# Patient Record
Sex: Male | Born: 1985
Health system: Southern US, Community
[De-identification: ages and names within clinical notes are randomized; demographics above are authoritative.]

## PROBLEM LIST (undated history)

## (undated) DIAGNOSIS — A630 Anogenital (venereal) warts: Secondary | ICD-10-CM

## (undated) DIAGNOSIS — S42302A Unspecified fracture of shaft of humerus, left arm, initial encounter for closed fracture: Secondary | ICD-10-CM

## (undated) DIAGNOSIS — F419 Anxiety disorder, unspecified: Secondary | ICD-10-CM

## (undated) DIAGNOSIS — R1013 Epigastric pain: Secondary | ICD-10-CM

## (undated) DIAGNOSIS — E663 Overweight: Secondary | ICD-10-CM

## (undated) DIAGNOSIS — F988 Other specified behavioral and emotional disorders with onset usually occurring in childhood and adolescence: Secondary | ICD-10-CM

## (undated) HISTORY — DX: Other specified behavioral and emotional disorders with onset usually occurring in childhood and adolescence: F98.8

## (undated) HISTORY — DX: Overweight: E66.3

## (undated) HISTORY — DX: Anogenital (venereal) warts: A63.0

## (undated) HISTORY — DX: Anxiety disorder, unspecified: F41.9

## (undated) HISTORY — DX: Epigastric pain: R10.13

## (undated) HISTORY — DX: Unspecified fracture of shaft of humerus, left arm, initial encounter for closed fracture: S42.302A

---
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
## 2018-10-25 ENCOUNTER — Other Ambulatory Visit: Payer: Self-pay

## 2018-10-25 ENCOUNTER — Encounter: Payer: Self-pay | Admitting: Internal Medicine

## 2018-10-25 ENCOUNTER — Ambulatory Visit: Payer: 59 | Admitting: Internal Medicine

## 2018-10-25 VITALS — BP 131/83 | HR 88 | Temp 97.9°F | Resp 12 | Ht 72.0 in | Wt 237.0 lb

## 2018-10-25 DIAGNOSIS — F900 Attention-deficit hyperactivity disorder, predominantly inattentive type: Secondary | ICD-10-CM

## 2018-10-25 MED ORDER — AMPHETAMINE-DEXTROAMPHETAMINE 15 MG PO TABS: ORAL_TABLET | ORAL | 0 refills | Status: DC

## 2018-10-25 NOTE — Progress Notes (Signed)
S - Presents for renewal of ADHD medicine. Last visit was late April with BP check then 120/78. Last fill date on PMP was 07/06/2018. He noted him and his wife had their baby, all healthy and just has not been able to get in to be seen. He is back at work and notes struggling with power points, reading same lines over 5-6 times and notices not being on the medicine in the recent past. When using prior, denied any SE concerns with no CP/palpitations/heart racing, no insomnia,    Current Outpatient Medications on File Prior to Visit  Medication Sig Dispense Refill  . amphetamine-dextroamphetamine (ADDERALL) 15 MG tablet Take 15 mg by mouth 2 (two) times daily.     No current facility-administered medications on file prior to visit.      Allergies  Allergen Reactions  . Sulfa Antibiotics     Unsure of reaction.  Said it occurred during childhood & it's what his mother told him    No tob hx  Records from his paper chart reviewed   O - NAD, masked  BP 131/83 (BP Location: Right Arm, Patient Position: Sitting, Cuff Size: Large)   Pulse 88   Temp 97.9 F (36.6 C) (Oral)   Resp 12   Ht 6' (1.829 m)   Wt 237 lb (107.5 kg)   SpO2 100%   BMI 32.14 kg/m    Heent - sclera anicteric Car - RRR without m/g/r Ext - no LE edema Neuro - Affect not flat, approp with conversation, speech is not rapid  Ass - ADHD - tolerating stimulant medicine to help in the past, brief time away recently and notices without   Plan - Has signed [place] P[place] reviewed Renewed medicine and aware cannot put refills on prescription Days away still encouraged and periodic days without medicine can be beneficial and also rec'ed using once to twice daily and if does not need that second dose, all the better with the goal being the least amount of the medicine needed at present Follow-up in person visits needed every three months, with refills possible through EHR between, should contact the office when running low on the  medicine to help generate this refill, and prn otherwise

## 2018-11-22 ENCOUNTER — Telehealth: Payer: Self-pay

## 2018-11-22 NOTE — Telephone Encounter (Signed)
Keith Wiggins questioning if he needs to come in for Rx refill for Adderall since Dr. Roxan Hockey no longer at our office.  Last office visit with Dr. Roxan Hockey was 10/25/2018.  I don't think he'll have to come in.  I will speak with the provider.  If they want to see him, I will let him know so he can schedule an appt.  AMD

## 2018-11-23 ENCOUNTER — Other Ambulatory Visit: Payer: Self-pay

## 2018-11-23 DIAGNOSIS — Z0289 Encounter for other administrative examinations: Secondary | ICD-10-CM

## 2018-11-23 DIAGNOSIS — F900 Attention-deficit hyperactivity disorder, predominantly inattentive type: Secondary | ICD-10-CM

## 2018-11-23 MED ORDER — AMPHETAMINE-DEXTROAMPHETAMINE 15 MG PO TABS: ORAL_TABLET | ORAL | 0 refills | Status: DC

## 2018-11-23 NOTE — Telephone Encounter (Signed)
Last office visit with Dr Roxan Hockey 10/25/2018 next due Dec 2020 as face to face required every 3 months for refills per clinic policy.  Electronic Rx sent to his pharmacy of choice today.  [place] PMP website reviewed; 21 Rx all from [facility] providers in the previous 2 years.  Last fill 10/25/2018.

## 2018-12-02 LAB — POCT URINALYSIS DIPSTICK
Bilirubin, UA: NEGATIVE
Blood, UA: NEGATIVE
Glucose, UA: NEGATIVE
Ketones, UA: NEGATIVE
Leukocytes, UA: NEGATIVE
Nitrite, UA: NEGATIVE
Protein, UA: NEGATIVE
Spec Grav, UA: 1.015 (ref 1.010–1.025)
Urobilinogen, UA: 0.2 E.U./dL
pH, UA: 6 (ref 5.0–8.0)

## 2018-12-20 ENCOUNTER — Other Ambulatory Visit: Payer: Self-pay | Admitting: Registered Nurse

## 2018-12-20 ENCOUNTER — Encounter: Payer: Self-pay | Admitting: Registered Nurse

## 2018-12-20 NOTE — Telephone Encounter (Signed)
Last appt with dr Roxan Hockey 10/2018.  Reviewed [place] PMP website.  Last fill 11/23/2018 by me. All other Rx 21 in past 2 years with [facility] [facility] provider. Electronic Rx sent to his pharmacy of choice for refill adderall 15mg  1-2 po daily #60 RF0.  Patient will need face to face visit for next fill Dec 2020.

## 2019-01-04 ENCOUNTER — Telehealth: Payer: Self-pay

## 2019-01-04 NOTE — Telephone Encounter (Signed)
Wellons, Marinell Blight, RN        I have emailed and called patient still no response.   Previous Messages  ----- Message -----  From: Aliene Altes, RN  Sent: 12/20/2018  3:03 PM EST  To: Madelin Headings  Subject: Appointment                    Tina refilled Adderall for November 2020   Needs to schedule an appointment for December 2020 for next refill of medication.   AMD

## 2019-01-10 ENCOUNTER — Ambulatory Visit: Payer: 59 | Admitting: Occupational Medicine

## 2019-01-10 ENCOUNTER — Other Ambulatory Visit: Payer: Self-pay

## 2019-01-10 ENCOUNTER — Encounter: Payer: Self-pay | Admitting: Occupational Medicine

## 2019-01-10 VITALS — BP 120/70 | HR 91 | Temp 98.6°F | Resp 12 | Ht 72.0 in | Wt 234.0 lb

## 2019-01-10 DIAGNOSIS — F988 Other specified behavioral and emotional disorders with onset usually occurring in childhood and adolescence: Secondary | ICD-10-CM

## 2019-01-10 DIAGNOSIS — Z0000 Encounter for general adult medical examination without abnormal findings: Secondary | ICD-10-CM

## 2019-01-11 MED ORDER — AMPHETAMINE-DEXTROAMPHETAMINE 15 MG PO TABS: ORAL_TABLET | ORAL | 0 refills | Status: DC

## 2019-01-11 NOTE — Telephone Encounter (Signed)
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  Patient stable on therapy reviewed PMP website printout see paper chart at [facility] last fill 12/20/2018 all Rx from [facility] providers in previous 2 years.  Next face to face required 90 days Mar 2021.

## 2019-01-14 ENCOUNTER — Emergency Department: Payer: 59

## 2019-01-14 ENCOUNTER — Emergency Department
Admission: EM | Admit: 2019-01-14 | Discharge: 2019-01-14 | Disposition: A | Discharge status: Home / Self Care | Payer: 59 | Attending: Emergency Medicine | Admitting: Emergency Medicine

## 2019-01-14 ENCOUNTER — Encounter: Payer: Self-pay | Admitting: Emergency Medicine

## 2019-01-14 ENCOUNTER — Other Ambulatory Visit: Payer: Self-pay

## 2019-01-14 DIAGNOSIS — M79604 Pain in right leg: Secondary | ICD-10-CM | POA: Insufficient documentation

## 2019-01-14 DIAGNOSIS — R079 Chest pain, unspecified: Secondary | ICD-10-CM | POA: Insufficient documentation

## 2019-01-14 DIAGNOSIS — M7989 Other specified soft tissue disorders: Secondary | ICD-10-CM | POA: Diagnosis not present

## 2019-01-14 DIAGNOSIS — R0781 Pleurodynia: Secondary | ICD-10-CM

## 2019-01-14 DIAGNOSIS — M79661 Pain in right lower leg: Secondary | ICD-10-CM | POA: Diagnosis not present

## 2019-01-14 DIAGNOSIS — R0602 Shortness of breath: Secondary | ICD-10-CM | POA: Diagnosis present

## 2019-01-14 LAB — CBC WITH DIFFERENTIAL/PLATELET
Abs Immature Granulocytes: 0.02 10*3/uL (ref 0.00–0.07)
Basophils Absolute: 0.1 10*3/uL (ref 0.0–0.1)
Basophils Relative: 1 %
Eosinophils Absolute: 0.2 10*3/uL (ref 0.0–0.5)
Eosinophils Relative: 2 %
HCT: 42.2 % (ref 39.0–52.0)
Hemoglobin: 14.8 g/dL (ref 13.0–17.0)
Immature Granulocytes: 0 %
Lymphocytes Relative: 30 %
Lymphs Abs: 2.4 10*3/uL (ref 0.7–4.0)
MCH: 30.6 pg (ref 26.0–34.0)
MCHC: 35.1 g/dL (ref 30.0–36.0)
MCV: 87.2 fL (ref 80.0–100.0)
Monocytes Absolute: 0.8 10*3/uL (ref 0.1–1.0)
Monocytes Relative: 10 %
Neutro Abs: 4.7 10*3/uL (ref 1.7–7.7)
Neutrophils Relative %: 57 %
Platelets: 322 10*3/uL (ref 150–400)
RBC: 4.84 MIL/uL (ref 4.22–5.81)
RDW: 12.7 % (ref 11.5–15.5)
WBC: 8.2 10*3/uL (ref 4.0–10.5)
nRBC: 0 % (ref 0.0–0.2)

## 2019-01-14 LAB — COMPREHENSIVE METABOLIC PANEL
ALT: 25 U/L (ref 0–44)
AST: 17 U/L (ref 15–41)
Albumin: 4.7 g/dL (ref 3.5–5.0)
Alkaline Phosphatase: 58 U/L (ref 38–126)
Anion gap: 6 (ref 5–15)
BUN: 14 mg/dL (ref 6–20)
CO2: 27 mmol/L (ref 22–32)
Calcium: 9.4 mg/dL (ref 8.9–10.3)
Chloride: 104 mmol/L (ref 98–111)
Creatinine, Ser: 0.96 mg/dL (ref 0.61–1.24)
GFR calc Af Amer: >60 mL/min (ref >60)
GFR calc non Af Amer: >60 mL/min (ref >60)
Glucose, Bld: 84 mg/dL (ref 70–99)
Potassium: 3.9 mmol/L (ref 3.5–5.1)
Sodium: 137 mmol/L (ref 135–145)
Total Bilirubin: 0.9 mg/dL (ref 0.3–1.2)
Total Protein: 7.9 g/dL (ref 6.5–8.1)

## 2019-01-14 LAB — TROPONIN I (HIGH SENSITIVITY): Troponin I (High Sensitivity): <2 ng/L (ref <18)

## 2019-01-14 LAB — FIBRIN DERIVATIVES D-DIMER (ARMC ONLY): Fibrin derivatives D-dimer (ARMC): 151.41 ng/mL (FEU) (ref 0.00–499.00)

## 2019-01-14 NOTE — ED Provider Notes (Addendum)
[facility] Emergency Department Provider Note       Time seen: ----------------------------------------- 12:41 PM on 01/14/2019 -----------------------------------------   I have reviewed the triage vital signs and the nursing notes.  HISTORY   Chief Complaint states feels like he cannot get a deep breath    HPI Keith Wiggins is a 33 y.o. male with a history of dyspepsia, ADD who presents to the ED for sensation of trouble getting a deep breath since yesterday.  He also has had some firmness in his right calf for the past 3 months and is concerned he has a DVT.  Pain is 4 out of 10 in the right leg.  Past Medical History:  Diagnosis Date  . ADD (attention deficit disorder)   . Arm fracture, left   . Dyspepsia   . Genital warts   . Overweight     Patient Active Problem List   Diagnosis Date Noted  . Attention deficit hyperactivity disorder (ADHD), predominantly inattentive type 10/25/2018    History reviewed. No pertinent surgical history.  Allergies Sulfa antibiotics  Social History Social History   Tobacco Use  . Smoking status: Never Smoker  . Smokeless tobacco: Never Used  Substance Use Topics  . Alcohol use: Yes  . Drug use: Not on file   Review of Systems Constitutional: Negative for fever. Cardiovascular: Negative for chest pain. Respiratory: Positive for shortness of breath Gastrointestinal: Negative for abdominal pain, vomiting and diarrhea. Musculoskeletal: Positive for right leg pain Skin: Negative for rash. Neurological: Negative for headaches, focal weakness or numbness.  All systems negative/normal/unremarkable except as stated in the HPI  ____________________________________________   PHYSICAL EXAM:  VITAL SIGNS: ED Triage Vitals  Enc Vitals Group     BP 01/14/19 1146 (!) 138/99     Pulse Rate 01/14/19 1146 82     Resp 01/14/19 1146 16     Temp 01/14/19 1146 98 F (36.7 C)     Temp Source 01/14/19 1146  Oral     SpO2 01/14/19 1146 100 %     Weight 01/14/19 1156 230 lb (104.3 kg)     Height 01/14/19 1156 6' (1.829 m)     Head Circumference --      Peak Flow --      Pain Score 01/14/19 1156 4     Pain Loc --      Pain Edu? --      Excl. in GC? --     Constitutional: Alert and oriented. Well appearing and in no distress. Eyes: Conjunctivae are normal. Normal extraocular movements. ENT      Head: Normocephalic and atraumatic.      Nose: No congestion/rhinnorhea.      Mouth/Throat: Mucous membranes are moist.      Neck: No stridor. Cardiovascular: Normal rate, regular rhythm. No murmurs, rubs, or gallops. Respiratory: Normal respiratory effort without tachypnea nor retractions. Breath sounds are clear and equal bilaterally. No wheezes/rales/rhonchi. Gastrointestinal: Soft and nontender. Normal bowel sounds Musculoskeletal: Nontender with normal range of motion in extremities. No lower extremity tenderness nor edema. Neurologic:  Normal speech and language. No gross focal neurologic deficits are appreciated.  Skin:  Skin is warm, dry and intact. No rash noted. Psychiatric: Mood and affect are normal. Speech and behavior are normal.  ____________________________________________  EKG: Interpreted by me.  Sinus rhythm with a rate of 78 bpm, normal PR interval, normal QRS, normal QT  ____________________________________________  ED COURSE:  As part of my medical decision making, I  reviewed the following data within the [place] History obtained from family if available, nursing notes, old chart and ekg, as well as notes from prior ED visits. Patient presented for pleuritic pain and right calf pain, we will assess with labs and imaging as indicated at this time.   Procedures  Keith Wiggins was evaluated in Emergency Department on 01/14/2019 for the symptoms described in the history of present illness. He was evaluated in the context of the global COVID-19 pandemic,  which necessitated consideration that the patient might be at risk for infection with the SARS-CoV-2 virus that causes COVID-19. Institutional protocols and algorithms that pertain to the evaluation of patients at risk for COVID-19 are in a state of rapid change based on information released by regulatory bodies including the CDC and federal and state organizations. These policies and algorithms were followed during the patient's care in the ED.  ____________________________________________   LABS (pertinent positives/negatives)  Labs Reviewed  COMPREHENSIVE METABOLIC PANEL  CBC WITH DIFFERENTIAL/PLATELET  FIBRIN DERIVATIVES D-DIMER ([facility] ONLY)  TROPONIN I (HIGH SENSITIVITY)    RADIOLOGY  Chest x-ray, right lower extremity ultrasound Were unremarkable ____________________________________________   DIFFERENTIAL DIAGNOSIS   Musculoskeletal pain, anxiety, MI, PE, pneumothorax, DVT  FINAL ASSESSMENT AND PLAN  Chest pain, right leg pain   Plan: The patient had presented for right leg pain and trouble breathing. Patient's labs are reassuring. Patient's imaging did not reveal any acute process.  His symptoms seem to be noncardiac and he is otherwise cleared for outpatient follow-up.   Laurence Aly, MD    Note: This note was generated in part or whole with voice recognition software. Voice recognition is usually quite accurate but there are transcription errors that can and very often do occur. I apologize for any typographical errors that were not detected and corrected.     Earleen Newport, MD 01/14/19 1404    Earleen Newport, MD 01/14/19 [phone]

## 2019-01-14 NOTE — ED Triage Notes (Signed)
States has sensation of trouble getting deep breath since yesterday. Has had firmness R calf x 3 months. Concerned he has DVT R leg.

## 2019-02-19 ENCOUNTER — Encounter: Payer: Self-pay | Admitting: Registered Nurse

## 2019-02-19 ENCOUNTER — Other Ambulatory Visit: Payer: Self-pay | Admitting: Registered Nurse

## 2019-02-19 DIAGNOSIS — F988 Other specified behavioral and emotional disorders with onset usually occurring in childhood and adolescence: Secondary | ICD-10-CM

## 2019-02-19 NOTE — Telephone Encounter (Signed)
[facility] patient.  Reviewed [place] PMP website last fill 01/16/2019  21 Rx from 6 [facility] providers in previous 2 years.  Last face to face visit 01/10/2019 with Dr Alto Denver.  BP 120/70 HR 91  Next face to face due 3 months 04/10/2019 with [facility] provider.  Electronic Rx sent with refill to his pharmacy of choice adderall 15mg  sig t1-2 po daily #60 RF0

## 2019-02-25 ENCOUNTER — Other Ambulatory Visit: Payer: Self-pay

## 2019-02-25 ENCOUNTER — Encounter: Payer: Self-pay | Admitting: Gynecology

## 2019-02-25 ENCOUNTER — Ambulatory Visit
Admission: EM | Admit: 2019-02-25 | Discharge: 2019-02-25 | Disposition: A | Discharge status: Home / Self Care | Payer: Managed Care, Other (non HMO) | Attending: Emergency Medicine | Admitting: Emergency Medicine

## 2019-02-25 DIAGNOSIS — R42 Dizziness and giddiness: Secondary | ICD-10-CM | POA: Diagnosis not present

## 2019-02-25 DIAGNOSIS — Z20822 Contact with and (suspected) exposure to covid-19: Secondary | ICD-10-CM | POA: Insufficient documentation

## 2019-02-25 DIAGNOSIS — Z8249 Family history of ischemic heart disease and other diseases of the circulatory system: Secondary | ICD-10-CM | POA: Diagnosis not present

## 2019-02-25 DIAGNOSIS — Z882 Allergy status to sulfonamides status: Secondary | ICD-10-CM | POA: Insufficient documentation

## 2019-02-25 DIAGNOSIS — J069 Acute upper respiratory infection, unspecified: Secondary | ICD-10-CM | POA: Diagnosis not present

## 2019-02-25 MED ORDER — FLUTICASONE PROPIONATE 50 MCG/ACT NA SUSP: 2.0000 | Freq: Every day | NASAL | 0 refills | Status: DC

## 2019-02-25 NOTE — Discharge Instructions (Addendum)
Drink plenty of fluids.  Get adequate rest.  Covid results should be available in 24 to 48 hours.  You should remain out of work until the results have been reported.  If you run high fevers have coughing or feeling worse or noticed any increase in your symptoms or any changes return to our clinic or go to the emergency room.

## 2019-02-25 NOTE — ED Provider Notes (Signed)
[facility]    CSN: [id number] Arrival date & time: 02/25/19  0817      History   Chief Complaint Chief Complaint  Patient presents with  . Dizziness    HPI Keith Wiggins is a 34 y.o. male.   HPI  34 year old male presents with 1 week history of on and off dizziness along with left ear pain and pressure.  Has noticed left-sided lymph node swelling with sore throat that he describes as scratchy.  Has also complained of increased pulse on occasion.  Denies any chest pain.  He has on occasion had difficulty with "getting enough air in".  Does not specifically complain of cough nor fever.  He has had no nausea or vomiting.  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  None of his coworkers have had similar symptoms.  Has 2 small children at home which he states has had runny noses.  His dizziness does not cause him to fall or stumble.  Patient appears well today.  Vital signs are normal.  I have reviewed medical records available to me shows that in December he was in the emergency room for chest pain and calf pain which was felt to be noncardiac.        Past Medical History:  Diagnosis Date  . ADD (attention deficit disorder)   . Arm fracture, left   . Dyspepsia   . Genital warts   . Overweight     Patient Active Problem List   Diagnosis Date Noted  . Attention deficit hyperactivity disorder (ADHD), predominantly inattentive type 10/25/2018    History reviewed. No pertinent surgical history.     Home Medications    Prior to Admission medications   Medication Sig Start Date End Date Taking? Authorizing Provider  amphetamine-dextroamphetamine (ADDERALL) 15 MG tablet TAKE ONE TABLET ONCE TO TWICE DAILY AS DIRECTED 02/19/19  Yes Betancourt, Tina A, NP  fluticasone (FLONASE) 50 MCG/ACT nasal spray Place 2 sprays into both nostrils daily. 02/25/19   Lorin Picket, PA-C    Family History Family History  Problem Relation Age of Onset  . Hypertension  Father     Social History Social History   Tobacco Use  . Smoking status: Never Smoker  . Smokeless tobacco: Never Used  Substance Use Topics  . Alcohol use: Yes  . Drug use: Not on file     Allergies   Sulfa antibiotics   Review of Systems Review of Systems  Constitutional: Positive for activity change. Negative for appetite change, chills, diaphoresis, fatigue and fever.  HENT: Positive for congestion, ear pain, sinus pressure and sore throat.   Respiratory: Positive for shortness of breath. Negative for wheezing and stridor.   Cardiovascular: Negative for palpitations.  Neurological: Positive for dizziness.     Physical Exam Triage Vital Signs ED Triage Vitals  Enc Vitals Group     BP 02/25/19 0828 126/82     Pulse Rate 02/25/19 0828 100     Resp 02/25/19 0828 16     Temp 02/25/19 0828 98 F (36.7 C)     Temp Source 02/25/19 0828 Oral     SpO2 02/25/19 0828 100 %     Weight 02/25/19 0833 230 lb (104.3 kg)     Height 02/25/19 0833 6' (1.829 m)     Head Circumference --      Peak Flow --      Pain Score 02/25/19 0833 7     Pain Loc --  Pain Edu? --      Excl. in GC? --    No data found.  Updated Vital Signs BP 126/82 (BP Location: Left Arm)   Pulse 100   Temp 98 F (36.7 C) (Oral)   Resp 16   Ht 6' (1.829 m)   Wt 230 lb (104.3 kg)   SpO2 100%   BMI 31.19 kg/m   Visual Acuity Right Eye Distance:   Left Eye Distance:   Bilateral Distance:    Right Eye Near:   Left Eye Near:    Bilateral Near:     Physical Exam Vitals and nursing note reviewed.  Constitutional:      General: He is not in acute distress.    Appearance: Normal appearance. He is normal weight. He is not ill-appearing, toxic-appearing or diaphoretic.  HENT:     Head: Normocephalic and atraumatic.     Right Ear: Tympanic membrane, ear canal and external ear normal. There is no impacted cerumen.     Left Ear: Tympanic membrane, ear canal and external ear normal. There is no  impacted cerumen.     Nose: Nose normal.     Mouth/Throat:     Mouth: Mucous membranes are moist.     Pharynx: Oropharynx is clear. No oropharyngeal exudate or posterior oropharyngeal erythema.  Eyes:     Extraocular Movements: Extraocular movements intact.     Pupils: Pupils are equal, round, and reactive to light.  Neck:     Comments: There is mild left-sided adenopathy anterior cervical chain and sub mandibular Cardiovascular:     Rate and Rhythm: Normal rate and regular rhythm.     Heart sounds: Normal heart sounds.  Pulmonary:     Effort: Pulmonary effort is normal.     Breath sounds: Normal breath sounds.  Musculoskeletal:        General: Normal range of motion.     Cervical back: Normal range of motion and neck supple. Tenderness present.  Lymphadenopathy:     Cervical: Cervical adenopathy present.  Skin:    General: Skin is warm and dry.  Neurological:     General: No focal deficit present.     Mental Status: He is alert and oriented to person, place, and time.  Psychiatric:        Mood and Affect: Mood normal.        Behavior: Behavior normal.        Thought Content: Thought content normal.        Judgment: Judgment normal.      UC Treatments / Results  Labs (all labs ordered are listed, but only abnormal results are displayed) Labs Reviewed  NOVEL CORONAVIRUS, NAA (HOSP ORDER, SEND-OUT TO REF LAB; TAT 18-24 HRS)    EKG   Radiology No results found.  Procedures Procedures (including critical care time)  Medications Ordered in UC Medications - No data to display  Initial Impression / Assessment and Plan / UC Course  I have reviewed the triage vital signs and the nursing notes.  Pertinent labs & imaging results that were available during my care of the patient were reviewed by me and considered in my medical decision making (see chart for details).   34 year old male presents with complaints of dizziness that he has had off and on for 1 week left ear  pain and pressure and swollen lymph nodes on the left side.  Had no nausea or vomiting.  He has had intermittent occasional sensations of not being able to  get enough air in which is not exertional.  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  None of his cohorts have similar symptoms.  2 small children at home that he states have had runny noses but no fevers.  Do not attend daycare.  Physical examination today is normal except for mild left-sided lymphadenopathy.  He was tested for Covid today with results should be available in 18 to 48 hours.  At the present time it is felt that he had an acute upper respiratory infection will be treated symptomatically.  I have prescribed fluticasone that he will use on a daily basis.  He was advised to use salt water gargles as necessary for comfort; he is also to drink plenty of fluids and get adequate rest.  Take Tylenol or Motrin as necessary for symptoms.  He was given return parameters for any change or worsening of symptoms.  Stay out of work until the results of the Covid testing have been reported.  He may return to work if they are negative.   Final Clinical Impressions(s) / UC Diagnoses   Final diagnoses:  Dizziness  Acute upper respiratory infection     Discharge Instructions     Drink plenty of fluids.  Get adequate rest.  Covid results should be available in 24 to 48 hours.  You should remain out of work until the results have been reported.  If you run high fevers have coughing or feeling worse or noticed any increase in your symptoms or any changes return to our clinic or go to the emergency room.    ED Prescriptions    Medication Sig Dispense Auth. Provider   fluticasone (FLONASE) 50 MCG/ACT nasal spray Place 2 sprays into both nostrils daily. 16 g Lutricia Feil, PA-C     PDMP not reviewed this encounter.   Lutricia Feil, PA-C 02/25/19 1733

## 2019-02-25 NOTE — ED Triage Notes (Signed)
Patient c/o dizziness on and off x 1 week. Pt. Stated left ear pain/ swollen left lymph node.Marland Kitchen Pt. Denies N/V

## 2019-02-26 LAB — NOVEL CORONAVIRUS, NAA (HOSP ORDER, SEND-OUT TO REF LAB; TAT 18-24 HRS): SARS-CoV-2, NAA: NOT DETECTED

## 2019-03-23 ENCOUNTER — Other Ambulatory Visit: Payer: Self-pay

## 2019-03-23 ENCOUNTER — Encounter: Payer: Self-pay | Admitting: Physician Assistant

## 2019-03-23 ENCOUNTER — Ambulatory Visit: Payer: 59 | Admitting: Physician Assistant

## 2019-03-23 VITALS — BP 122/74 | HR 84 | Temp 98.9°F | Ht 72.0 in | Wt 236.0 lb

## 2019-03-23 DIAGNOSIS — R002 Palpitations: Secondary | ICD-10-CM

## 2019-03-23 DIAGNOSIS — F900 Attention-deficit hyperactivity disorder, predominantly inattentive type: Secondary | ICD-10-CM

## 2019-03-23 NOTE — Progress Notes (Signed)
Subjective:    Patient ID: Keith Wiggins, male    DOB: 13-Jul-1985, 33 y.o.   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  Episodes started approximately Christmas season  Continuing through the present. Denies increased caffeine intake  On questioning he doesn't answer directly until pressed- but then  Reports he has taken his Adderall RX on a random dosing schedule for the last month or so.  Sometimes BID, -sometimes daily---sometimes not at all, and maybe skip for multiple days at a time.  Random start and stop pattern.  He has had the abnormal feelings during much of this same time span.  He didnt associate the possibility of a connection between totally irregular medication schedule and unusual sensations  He is not sure if he could be counted on to take medication on a regular schedule now that he has been random. He feels his wife would assist as she has encouraged him to be seen.  Denies that he has any interest in harming himself or causing himself to be sick. Now recognizes that his Rx schedule may have been problematic in recent experiences. Eager to readdress   Review of Systems  Constitutional: Negative.   HENT: Negative.   Eyes: Negative.   Respiratory: Positive for chest tightness. Negative for apnea, cough, choking, wheezing and stridor.    Intermittent episode of chest tightening "like I cant get my breath...but actually I can"  Cardiovascular: Negative for chest pain, palpitations and leg swelling.       Pressure feeling central chest,  No dizziness, fainting, chest pain. Has not had any limitation in daily activity   Gastrointestinal: Negative.   Genitourinary: Negative.   Musculoskeletal: Negative.   Neurological: Negative for dizziness, tremors, syncope, speech difficulty, weakness, light-headedness and numbness.  Psychiatric/Behavioral: The patient is nervous/anxious.        Has saved any of the medication doses that he didn't take and has them "for back up if needed"       Objective:   Physical Exam Vitals and nursing note reviewed.  Constitutional:      General: He is not in acute distress.    Appearance: Normal appearance.     Comments: Mildly overweight,   HENT:     Head: Normocephalic and atraumatic.     Mouth/Throat:     Mouth: Mucous membranes are moist.  Eyes:     Extraocular Movements: Extraocular movements intact.  Cardiovascular:     Rate and Rhythm: Normal rate and regular rhythm.     Pulses: Normal pulses.     Heart sounds: Normal heart sounds.     Comments: occassional episodes of suspected tachycardia per patient report. RSR 88 today  Patient given opportunity to  Listen to his heart with my stethoscope-pleased Pulmonary:     Effort: Pulmonary effort is normal. No  respiratory distress.     Breath sounds: Normal breath sounds.  Chest:     Chest wall: No tenderness.  Musculoskeletal:        General: Normal range of motion.     Cervical back: Normal range of motion.  Skin:    General: Skin is warm and dry.     Capillary Refill: Capillary refill takes less than 2 seconds.  Neurological:     General: No focal deficit present.     Mental Status: He is alert.  Psychiatric:        Mood and Affect: Mood normal.        Behavior: Behavior normal.     Comments: Mildly anxious, concerned, worried  about changes in body--" I don't remember that blood vessel looking like that "-- on back of his hand Alert, interactive, good eye contact, good sentence structure and thought content Denies any thought of self-harm. More the opposite, worrying about things " that didn't use to bother me"       Assessment & Plan:  Keith Wiggins given talk time and support. He is obviously concerned and states wants to be "well again".  He has previously been seen by [facility] and is encouraged to get back in touch with the group. We can assist, but he prefers to take charge right now.  On the way home he will stop and get a One Week long, daily AM/PM pill dosing box. This will organize a week of medication on his AM/PM correct schedule Adderal 15 mg BID . He will ask his wife to help him load the box and keep the balance of his medications in her control. He agrees to have her monitor his daily usage and to give him feedback per her observations. He understands she is welcome to come with him to a follow up visit if they both agree.  Keith Wiggins understands that if any symptoms occur that cause him to be frightened or feel significant pain, cause difficulty breathing that he can't change with holding his breath or brown paper bag re- breathe He is to present to Urgent Care or ER of his choice.  We look forward to seeing him back in 2 weeks for follow up and encourage call-in if we can help with questions

## 2019-03-23 NOTE — Progress Notes (Signed)
Dizzy & anxious x 1 month - episode this morning - woke up feeling dizzy. Episode can last up to two hours - intermittent. Experiences episodes evey day - some days more & some days less.  Can't identify anything new in his life that would causing this problem. States wasn't experiencing this in 01/2019 when he had his last physical.

## 2019-03-24 ENCOUNTER — Encounter: Payer: Self-pay | Admitting: Physician Assistant

## 2019-04-04 ENCOUNTER — Telehealth: Payer: Self-pay | Admitting: General Practice

## 2019-04-04 NOTE — Telephone Encounter (Signed)
PT states that he only has a week left of his adderall meds. He would also like to see if he can do a televisit with you on Thursday instead of in person.

## 2019-04-06 ENCOUNTER — Ambulatory Visit: Payer: Self-pay

## 2019-04-10 ENCOUNTER — Other Ambulatory Visit: Payer: Self-pay | Admitting: Registered Nurse

## 2019-04-10 DIAGNOSIS — F988 Other specified behavioral and emotional disorders with onset usually occurring in childhood and adolescence: Secondary | ICD-10-CM

## 2019-04-11 NOTE — Telephone Encounter (Signed)
Please schedule appointment for office visit

## 2019-04-11 NOTE — Telephone Encounter (Signed)
Needs office follow up visit before additional refills

## 2019-04-14 DIAGNOSIS — R69 Illness, unspecified: Secondary | ICD-10-CM | POA: Diagnosis not present

## 2019-04-14 DIAGNOSIS — H538 Other visual disturbances: Secondary | ICD-10-CM | POA: Diagnosis not present

## 2019-04-14 DIAGNOSIS — R2689 Other abnormalities of gait and mobility: Secondary | ICD-10-CM | POA: Diagnosis not present

## 2019-04-26 ENCOUNTER — Ambulatory Visit: Payer: Self-pay | Admitting: Registered Nurse

## 2019-04-26 ENCOUNTER — Other Ambulatory Visit: Payer: Self-pay

## 2019-04-26 VITALS — BP 133/80 | HR 78 | Temp 97.8°F | Wt 224.0 lb

## 2019-04-26 DIAGNOSIS — R42 Dizziness and giddiness: Secondary | ICD-10-CM

## 2019-04-26 DIAGNOSIS — F900 Attention-deficit hyperactivity disorder, predominantly inattentive type: Secondary | ICD-10-CM

## 2019-04-26 DIAGNOSIS — J301 Allergic rhinitis due to pollen: Secondary | ICD-10-CM

## 2019-04-26 DIAGNOSIS — H65193 Other acute nonsuppurative otitis media, bilateral: Secondary | ICD-10-CM

## 2019-04-26 DIAGNOSIS — Z23 Encounter for immunization: Secondary | ICD-10-CM

## 2019-04-26 MED ORDER — LORATADINE 10 MG PO TABS: 10.0000 mg | ORAL_TABLET | Freq: Every day | ORAL | 11 refills | Status: DC

## 2019-04-26 MED ORDER — FLUTICASONE PROPIONATE 50 MCG/ACT NA SUSP: 1.0000 | Freq: Two times a day (BID) | NASAL | 1 refills | Status: DC

## 2019-04-26 MED ORDER — SALINE SPRAY 0.65 % NA SOLN: 2.0000 | NASAL | 0 refills | Status: DC

## 2019-04-26 NOTE — Patient Instructions (Addendum)
https://www.cdc.gov/vaccines/hcp/vis/vis-statements/tdap.pdf">  Tdap (Tetanus, Diphtheria, Pertussis) Vaccine: What You Need to Know 1. Why get vaccinated? Tdap vaccine can prevent tetanus, diphtheria, and pertussis. Diphtheria and pertussis spread from person to person. Tetanus enters the body through cuts or wounds.  TETANUS (T) causes painful stiffening of the muscles. Tetanus can lead to serious health problems, including being unable to open the mouth, having trouble swallowing and breathing, or death.  DIPHTHERIA (D) can lead to difficulty breathing, heart failure, paralysis, or death.  PERTUSSIS (aP), also known as "whooping cough," can cause uncontrollable, violent coughing which makes it hard to breathe, eat, or drink. Pertussis can be extremely serious in babies and young children, causing pneumonia, convulsions, brain damage, or death. In teens and adults, it can cause weight loss, loss of bladder control, passing out, and rib fractures from severe coughing. 2. Tdap vaccine Tdap is only for children 7 years and older, adolescents, and adults.  Adolescents should receive a single dose of Tdap, preferably at age 53 or 35 years. Pregnant women should get a dose of Tdap during every pregnancy, to protect the newborn from pertussis. Infants are most at risk for severe, life-threatening complications from pertussis. Adults who have never received Tdap should get a dose of Tdap. Also, adults should receive a booster dose every 10 years, or earlier in the case of a severe and dirty wound or burn. Booster doses can be either Tdap or Td (a different vaccine that protects against tetanus and diphtheria but not pertussis). Tdap may be given at the same time as other vaccines. 3. Talk with your health care provider Tell your vaccine provider if the person getting the vaccine:  Has had an allergic reaction after a previous dose of any vaccine that protects against tetanus, diphtheria, or pertussis,  or has any severe, life-threatening allergies.  Has had a coma, decreased level of consciousness, or prolonged seizures within 7 days after a previous dose of any pertussis vaccine (DTP, DTaP, or Tdap).  Has seizures or another nervous system problem.  Has ever had Guillain-Barr Syndrome (also called GBS).  Has had severe pain or swelling after a previous dose of any vaccine that protects against tetanus or diphtheria. In some cases, your health care provider may decide to postpone Tdap vaccination to a future visit.  People with minor illnesses, such as a cold, may be vaccinated. People who are moderately or severely ill should usually wait until they recover before getting Tdap vaccine.  Your health care provider can give you more information. 4. Risks of a vaccine reaction  Pain, redness, or swelling where the shot was given, mild fever, headache, feeling tired, and nausea, vomiting, diarrhea, or stomachache sometimes happen after Tdap vaccine. People sometimes faint after medical procedures, including vaccination. Tell your provider if you feel dizzy or have vision changes or ringing in the ears.  As with any medicine, there is a very remote chance of a vaccine causing a severe allergic reaction, other serious injury, or death. 5. What if there is a serious problem? An allergic reaction could occur after the vaccinated person leaves the clinic. If you see signs of a severe allergic reaction (hives, swelling of the face and throat, difficulty breathing, a fast heartbeat, dizziness, or weakness), call 9-1-1 and get the person to the nearest hospital. For other signs that concern you, call your health care provider.  Adverse reactions should be reported to the Vaccine Adverse Event Reporting System (VAERS). Your health care provider will usually file this report,  or you can do it yourself. Visit the VAERS website at www.vaers.LAgents.no or call 913-290-1935. VAERS is only for reporting  reactions, and VAERS staff do not give medical advice. 6. The National Vaccine Injury Compensation Program The [employer] Vaccine Injury Compensation Program (VICP) is a federal program that was created to compensate people who may have been injured by certain vaccines. Visit the VICP website at SpiritualWord.at or call 641-722-7254 to learn about the program and about filing a claim. There is a time limit to file a claim for compensation. 7. How can I learn more?  Ask your health care provider.  Call your local or state health department.  Contact the Centers for Disease Control and Prevention (CDC): ? Call 6260299809 (1-800-CDC-INFO) or ? Visit CDC's website at PicCapture.uy Vaccine Information Statement Tdap (Tetanus, Diphtheria, Pertussis) Vaccine (05/11/2018) This information is not intended to replace advice given to you by your health care provider. Make sure you discuss any questions you have with your health care provider. Document Revised: 05/20/2018 Document Reviewed: 05/23/2018 Elsevier Patient Education  2020 [employer]. Dental Caries, Adult  Dental caries are spots of decay (cavities) in the outer layer of your tooth (enamel). The natural bacteria in your mouth produce acid when breaking down sugary foods and drinks. When you eat or drink a lot of sugary foods and liquids, a lot of acid is produced. The acid destroys the protective enamel of your tooth, leading to tooth decay. It is important to treat your tooth decay as soon as possible. Untreated dental caries can spread decay and lead to painful infection. Brushing regularly with fluoride toothpaste (oral hygiene) and getting regular dental checkups can help prevent dental caries. What are the causes? Dental caries are caused by the acid that is produced when bacteria break down sugary or acidic foods and drinks. What increases the risk? This condition is more likely to develop in young adults.  This condition is also more likely to develop in people who:  Drink a lot of sugary liquids, including alcoholic drinks, such as champagne.  Eat a lot of sweets and carbohydrates.  Drink water that is not treated with fluoride.  Have poor oral hygiene.  Have deep grooves in their teeth.  Take certain medicines that decrease saliva. What are the signs or symptoms? Symptoms of dental caries include:  White, brown, or black spots on the teeth.  Pain.  Swollen or bleeding gums. How is this diagnosed? Your dentist may suspect dental caries from your signs and symptoms. The dentist will also do an oral exam. This may include X-rays to confirm the diagnosis. Sometimes lights, a thin probe, and dyes are used to find dental caries (using electrical conductivity or using laser reflection). How is this treated? Treatment for dental caries usually involves a procedure to remove the decay and restore the tooth with a filling or a sealant. Follow these instructions at home:  Practice good oral hygiene. This keeps your mouth and gums healthy. Use fluoride toothpaste to brush your teeth twice a day, and floss once a day.  If your dentist prescribed an antibiotic medicine to treat an infection, take it as told by your dentist. Do not stop taking the antibiotic even if your condition improves.  Keep all follow-up visits as told by your dentist. This is important. This includes all cleanings. How is this prevented? To prevent dental caries:  Brush your teeth every morning and night with fluoride toothpaste.  Get regular dental cleanings.  If you are at  risk of dental caries, wash your mouth with prescription mouthwash (chlorhexidine) and apply topical fluoride to your teeth.  Drink fluoridated water regularly.  Drink water instead of sugary drinks.  Eat healthy meals and snacks. Contact a health care provider if:  You have symptoms of tooth decay. This information is not intended to  replace advice given to you by your health care provider. Make sure you discuss any questions you have with your health care provider. Document Revised: 01/08/2017 Document Reviewed: 08/09/2015 Elsevier Patient Education  2020 [employer]. How to Perform a Sinus Rinse A sinus rinse is a home treatment that is used to rinse your sinuses with a sterile mixture of salt and water (saline solution). Sinuses are air-filled spaces in your skull behind the bones of your face and forehead that open into your nasal cavity. A sinus rinse can help to clear mucus, dirt, dust, or pollen from your nasal cavity. You may do a sinus rinse when you have a cold, a virus, nasal allergy symptoms, a sinus infection, or stuffiness in your nose or sinuses. Talk with your health care provider about whether a sinus rinse might help you. What are the risks? A sinus rinse is generally safe and effective. However, there are a few risks, which include:  A burning sensation in your sinuses. This may happen if you do not make the saline solution as directed. Be sure to follow all directions when making the saline solution.  Nasal irritation.  Infection from contaminated water. This is rare, but possible. Do not do a sinus rinse if you have had ear or nasal surgery, ear infection, or blocked ears. Supplies needed:  Saline solution or powder.  Distilled or sterile water may be needed to mix with saline powder. ? You may use boiled and cooled tap water. Boil tap water for 5 minutes; cool until it is lukewarm. Use within 24 hours. ? Do not use regular tap water to mix with the saline solution.  Neti pot or nasal rinse bottle. These supplies release the saline solution into your nose and through your sinuses. Neti pots and nasal rinse bottles can be purchased at [employer], a health food store, or online. How to perform a sinus rinse  1. Wash your hands with soap and water. 2. Wash your device according to the  directions that came with the product and then dry it. 3. Use the solution that comes with your product or one that is sold separately in stores. Follow the mixing directions on the package if you need to mix with sterile or distilled water. 4. Fill the device with the amount of saline solution noted in the device instructions. 5. Stand over a sink and tilt your head sideways over the sink. 6. Place the spout of the device in your upper nostril (the one closer to the ceiling). 7. Gently pour or squeeze the saline solution into your nasal cavity. The liquid should drain out from the lower nostril if you are not too congested. 8. While rinsing, breathe through your open mouth. 9. Gently blow your nose to clear any mucus and rinse solution. Blowing too hard may cause ear pain. 10. Repeat in your other nostril. 11. Clean and rinse your device with clean water and then air-dry it. Talk with your health care provider or pharmacist if you have questions about how to do a sinus rinse. Summary  A sinus rinse is a home treatment that is used to rinse your sinuses with a  sterile mixture of salt and water (saline solution).  A sinus rinse is generally safe and effective. Follow all instructions carefully.  Before doing a sinus rinse, talk with your health care provider about whether it would be helpful for you. This information is not intended to replace advice given to you by your health care provider. Make sure you discuss any questions you have with your health care provider. Document Revised: 11/23/2016 Document Reviewed: 11/23/2016 Elsevier Patient Education  2020 [employer]. Allergic Rhinitis, Adult Allergic rhinitis is an allergic reaction that affects the mucous membrane inside the nose. It causes sneezing, a runny or stuffy nose, and the feeling of mucus going down the back of the throat (postnasal drip). Allergic rhinitis can be mild to severe. There are two types of allergic  rhinitis:  Seasonal. This type is also called hay fever. It happens only during certain seasons.  Perennial. This type can happen at any time of the year. What are the causes? This condition happens when the body's defense system (immune system) responds to certain harmless substances called allergens as though they were germs.  Seasonal allergic rhinitis is triggered by pollen, which can come from grasses, trees, and weeds. Perennial allergic rhinitis may be caused by:  House dust mites.  Pet dander.  Mold spores. What are the signs or symptoms? Symptoms of this condition include:  Sneezing.  Runny or stuffy nose (nasal congestion).  Postnasal drip.  Itchy nose.  Tearing of the eyes.  Trouble sleeping.  Daytime sleepiness. How is this diagnosed? This condition may be diagnosed based on:  Your medical history.  A physical exam.  Tests to check for related conditions, such as: ? Asthma. ? Pink eye. ? Ear infection. ? Upper respiratory infection.  Tests to find out which allergens trigger your symptoms. These may include skin or blood tests. How is this treated? There is no cure for this condition, but treatment can help control symptoms. Treatment may include:  Taking medicines that block allergy symptoms, such as antihistamines. Medicine may be given as a shot, nasal spray, or pill.  Avoiding the allergen.  Desensitization. This treatment involves getting ongoing shots until your body becomes less sensitive to the allergen. This treatment may be done if other treatments do not help.  If taking medicine and avoiding the allergen does not work, new, stronger medicines may be prescribed. Follow these instructions at home:  Find out what you are allergic to. Common allergens include smoke, dust, and pollen.  Avoid the things you are allergic to. These are some things you can do to help avoid allergens: ? Replace carpet with wood, tile, or vinyl flooring. Carpet  can trap dander and dust. ? Do not smoke. Do not allow smoking in your home. ? Change your heating and air conditioning filter at least once a month. ? During allergy season:  Keep windows closed as much as possible.  Plan outdoor activities when pollen counts are lowest. This is usually during the evening hours.  When coming indoors, change clothing and shower before sitting on furniture or bedding.  Take over-the-counter and prescription medicines only as told by your health care provider.  Keep all follow-up visits as told by your health care provider. This is important. Contact a health care provider if:  You have a fever.  You develop a persistent cough.  You make whistling sounds when you breathe (you wheeze).  Your symptoms interfere with your normal daily activities. Get help right away if:  You  have shortness of breath. Summary  This condition can be managed by taking medicines as directed and avoiding allergens.  Contact your health care provider if you develop a persistent cough or fever.  During allergy season, keep windows closed as much as possible. This information is not intended to replace advice given to you by your health care provider. Make sure you discuss any questions you have with your health care provider. Document Revised: 01/08/2017 Document Reviewed: 03/05/2016 Elsevier Patient Education  2020 [employer]. Vertigo Vertigo is the feeling that you or your surroundings are moving when they are not. This feeling can come and go at any time. Vertigo often goes away on its own. Vertigo can be dangerous if it occurs while you are doing something that could endanger you or others, such as driving or operating machinery. Your health care provider will do tests to determine the cause of your vertigo. Tests will also help your health care provider decide how best to treat your condition. Follow these instructions at home: Eating and drinking      Drink  enough fluid to keep your urine pale yellow.  Do not drink alcohol. Activity  Return to your normal activities as told by your health care provider. Ask your health care provider what activities are safe for you.  In the morning, first sit up on the side of the bed. When you feel okay, stand slowly while you hold onto something until you know that your balance is fine.  Move slowly. Avoid sudden body or head movements or certain positions, as told by your health care provider.  If you have trouble walking or keeping your balance, try using a cane for stability. If you feel dizzy or unstable, sit down right away.  Avoid doing any tasks that would cause danger to you or others if vertigo occurs.  Avoid bending down if you feel dizzy. Place items in your home so that they are easy for you to reach without leaning over.  Do not drive or use heavy machinery if you feel dizzy. General instructions  Take over-the-counter and prescription medicines only as told by your health care provider.  Keep all follow-up visits as told by your health care provider. This is important. Contact a health care provider if:  Your medicines do not relieve your vertigo or they make it worse.  You have a fever.  Your condition gets worse or you develop new symptoms.  Your family or friends notice any behavioral changes.  Your nausea or vomiting gets worse.  You have numbness or a prickling and tingling sensation in part of your body. Get help right away if you:  Have difficulty moving or speaking.  Are always dizzy.  Faint.  Develop severe headaches.  Have weakness in your hands, arms, or legs.  Have changes in your hearing or vision.  Develop a stiff neck.  Develop sensitivity to light. Summary  Vertigo is the feeling that you or your surroundings are moving when they are not.  Your health care provider will do tests to determine the cause of your vertigo.  Follow instructions for  home care. You may be told to avoid certain tasks, positions, or movements.  Contact a health care provider if your medicines do not relieve your symptoms, or if you have a fever, nausea, vomiting, or changes in behavior.  Get help right away if you have severe headaches or difficulty speaking, or you develop hearing or vision problems. This information is not intended  to replace advice given to you by your health care provider. Make sure you discuss any questions you have with your health care provider. Document Revised: 12/20/2017 Document Reviewed: 12/20/2017 Elsevier Patient Education  2020 [employer].

## 2019-04-26 NOTE — Progress Notes (Signed)
Subjective:    Patient ID: Keith Wiggins, male    DOB: 09/28/1985, 34 y.o.   MRN: [id number]  33y/o married caucasian male established patient works for [employer] full time  Typically 3 24 hour shifts one week then two shifts the next. Patient denied dizziness with ladder use or feeling off balance.  Has noticed Denied any recent bad fires/prolonged smoke exposure.  Last ladder use weeks ago at his home.   Has been seeing [facility] clinic provider as new PCM evaluated for dizziness and prescribed meclizine but hasn't picked up from pharmacy yet.  History of spring allergies used flonase in the past hasn't restarted yet.  Last adderall refill PA Nedra Hai 04/11/2019 30 day supply states working well for him and now taking on a regular basis. Reviewed [place] PMP website.   Denied HI/SI.  Did not schedule EAP appt.  He is still in charge of his medications.  Didn't buy pill box or have wife start monitoring his medications.  Patient feels safe to perform his job duties.  Hasn't seen a dentist in 6 years thinks he has cavities reports he brushes and flosses.     Review of Systems  Constitutional: Negative for activity change, appetite change, chills, diaphoresis, fatigue and fever.  HENT: Negative for dental problem, tinnitus and voice change.   Eyes: Negative for photophobia, pain, discharge, redness, itching and visual disturbance.  Respiratory: Negative for cough, shortness of breath, wheezing and stridor.   Cardiovascular: Negative for palpitations.  Gastrointestinal: Negative for abdominal pain, diarrhea, nausea and vomiting.  Endocrine: Negative for cold intolerance and heat intolerance.  Genitourinary: Negative for difficulty urinating.  Musculoskeletal: Negative for gait problem, neck pain and neck stiffness.  Skin: Negative for rash.  Allergic/Immunologic: Positive for environmental allergies. Negative for food allergies.  Neurological: Positive for dizziness. Negative for  tremors, seizures, syncope, facial asymmetry, speech difficulty, weakness, light-headedness, numbness and headaches.  Hematological: Negative for adenopathy. Does not bruise/bleed easily.  Psychiatric/Behavioral: Negative for agitation, confusion and sleep disturbance.       Objective:   Physical Exam Vitals and nursing note reviewed.  Constitutional:      General: He is awake. He is not in acute distress.    Appearance: Normal appearance. He is well-developed and overweight. He is not ill-appearing, toxic-appearing or diaphoretic.  HENT:     Head: Normocephalic and atraumatic.     Jaw: There is normal jaw occlusion. No trismus.     Salivary Glands: Right salivary gland is not diffusely enlarged or tender. Left salivary gland is not diffusely enlarged or tender.     Right Ear: Hearing, ear canal and external ear normal. A middle ear effusion is present. There is no impacted cerumen.     Left Ear: Hearing, ear canal and external ear normal. A middle ear effusion is present. There is no impacted cerumen.     Nose: Mucosal edema and rhinorrhea present. No nasal deformity, septal deviation, signs of injury, laceration, nasal tenderness or congestion.     Right Turbinates: Not enlarged, swollen or pale.     Left Turbinates: Not enlarged, swollen or pale.     Right Sinus: No maxillary sinus tenderness or frontal sinus tenderness.     Left Sinus: No maxillary sinus tenderness or frontal sinus tenderness.     Mouth/Throat:     Lips: Pink. No lesions.     Mouth: Mucous membranes are moist. Mucous membranes are not pale, not dry and not cyanotic. No  injury, lacerations, oral lesions or angioedema.     Dentition: Normal dentition. Does not have dentures. No dental tenderness, gingival swelling, dental abscesses or gum lesions.     Tongue: No lesions. Tongue does not deviate from midline.     Palate: No lesions.     Pharynx: Uvula midline. Pharyngeal swelling and posterior oropharyngeal erythema  present. No oropharyngeal exudate or uvula swelling.     Tonsils: No tonsillar exudate or tonsillar abscesses. 0 on the right. 0 on the left.     Comments: Cobblestoning posterior pharynx; bilateral TMS air fluid level clear; bilateral allergic shiners; clear discharge bilateral nasal turbinates Eyes:     General: Lids are normal. Gaze aligned appropriately. Allergic shiner present. No visual field deficit or scleral icterus.       Right eye: No foreign body, discharge or hordeolum.        Left eye: No foreign body, discharge or hordeolum.     Extraocular Movements: Extraocular movements intact.     Right eye: Normal extraocular motion and no nystagmus.     Left eye: Normal extraocular motion and no nystagmus.     Conjunctiva/sclera: Conjunctivae normal.     Right eye: Right conjunctiva is not injected. No chemosis, exudate or hemorrhage.    Left eye: Left conjunctiva is not injected. No chemosis, exudate or hemorrhage.    Pupils: Pupils are equal, round, and reactive to light. Pupils are equal.     Right eye: Pupil is round and reactive.     Left eye: Pupil is round and reactive.  Neck:     Thyroid: No thyroid mass, thyromegaly or thyroid tenderness.     Vascular: No carotid bruit.     Trachea: Trachea and phonation normal. No tracheal tenderness or tracheal deviation.  Cardiovascular:     Rate and Rhythm: Normal rate and regular rhythm.     Chest Wall: PMI is not displaced.     Pulses: Normal pulses.          Radial pulses are 2+ on the right side and 2+ on the left side.     Heart sounds: Normal heart sounds, S1 normal and S2 normal. No murmur. No friction rub. No gallop.   Pulmonary:     Effort: Pulmonary effort is normal. No respiratory distress.     Breath sounds: Normal breath sounds and air entry. No stridor, decreased air movement or transmitted upper airway sounds. No decreased breath sounds, wheezing, rhonchi or rales.     Comments: Wearing cloth mask due to covid 19 pandemic;  spoke full sentences without difficulty; no cough observed in exam room Abdominal:     General: Abdomen is flat. There is no distension.     Palpations: Abdomen is soft.  Musculoskeletal:        General: No swelling, tenderness, deformity or signs of injury. Normal range of motion.     Right shoulder: Normal.     Left shoulder: Normal.     Right elbow: Normal.     Left elbow: Normal.     Right hand: Normal.     Left hand: Normal.     Cervical back: Normal, normal range of motion and neck supple. No swelling, edema, deformity, erythema, signs of trauma, lacerations, rigidity, torticollis or crepitus. No pain with movement. Normal range of motion.     Thoracic back: Normal.     Lumbar back: Normal.     Right hip: Normal.     Left hip: Normal.  Right knee: Normal.     Left knee: Normal.  Lymphadenopathy:     Head:     Right side of head: No submental, submandibular, tonsillar, preauricular, posterior auricular or occipital adenopathy.     Left side of head: No submental, submandibular, tonsillar, preauricular, posterior auricular or occipital adenopathy.     Cervical: No cervical adenopathy.     Right cervical: No superficial, deep or posterior cervical adenopathy.    Left cervical: No superficial, deep or posterior cervical adenopathy.  Skin:    General: Skin is warm and dry.     Capillary Refill: Capillary refill takes less than 2 seconds.     Coloration: Skin is not ashen, cyanotic, jaundiced, mottled, pale or sallow.     Findings: No abrasion, abscess, acne, bruising, burn, ecchymosis, erythema, signs of injury, laceration, lesion, petechiae, rash or wound.     Nails: There is no clubbing.  Neurological:     General: No focal deficit present.     Mental Status: He is alert and oriented to person, place, and time. Mental status is at baseline.     GCS: GCS eye subscore is 4. GCS verbal subscore is 5. GCS motor subscore is 6.     Cranial Nerves: Cranial nerves are intact. No  cranial nerve deficit, dysarthria or facial asymmetry.     Sensory: Sensation is intact. No sensory deficit.     Motor: Motor function is intact. No weakness, tremor, atrophy, abnormal muscle tone, seizure activity or pronator drift.     Coordination: Coordination is intact. Romberg sign negative. Coordination normal.     Gait: Gait is intact. Gait normal.     Comments: Gait sure and steady in hallway; on/off exam table and in/out of chair without difficulty; bilateral hand grasp equal 5/5; unable to trigger dizzyness with bending over head down position and rotation side to side; standing up/sitting down in chair quickly;   Psychiatric:        Mood and Affect: Mood normal.        Speech: Speech normal.        Behavior: Behavior normal. Behavior is cooperative.        Thought Content: Thought content normal.        Judgment: Judgment normal.     Comments: PHQ-9   In the past two weeks how often have you been bothered by these symptoms/problems:  Not at all 0; several days 1; more than half the days 2; nearly every day 3  1.Little interest or pleasure in doing things 0 2. Feeling down,depressed or hopeless 0 3. Trouble falling or staying asleep or sleeping too much 0 4. Feeling tired or having little energy 0 5. Poor appetite or overeating 0 6. Feeling bad about yourself or that you are a failure or have let yourself or your family Down 0 7. Trouble concentrating on things, such as reading the newspaper or watching TV 0 8. Moving or speaking so slowly that other people could have noticed? Or the opposite, being so fidgety or restless that you have been moving around a lot more than usual 0 9. Thoughts that you would be better off dead or hurting yourself in some way 0 Score:    0/27 5 to 9 mild; 10 to 14 moderate; 15 to 19 moderately severe; >20 severe depression  If you checked off any problems, how difficult have these problems made it for you to do your work, take care of things at  home, or get  along with other peope? Not at all  GAD 7 Over the past two weeks, how often have you been bothered by the following problems:   not at all 0; several days 1; more than half the days 2; nearly every day 3  1. Feeling nervous, anxious or on edge  0 2. Not being able to stop or control worrying 0 3. Worrying too much about different things 0 4. Trouble relaxing 0 5. Being so restlness that it is hard to sit still 0 6. Becoming easily annoyed or irritable 0 7. Feeling afraid as if something awful might happen 0 Score   0  /21 If you check off any problems, how difficult have these problems made it for you to do your work, take care of things at home, or get along with other peope? Not at  Score 5 to 9 mild; 10 to 14 moderate; 15 to 21 severe anxiety     Reviewed care everywhere and chart review labs/encounters for the previous year noted in Epic.  [facility], [facility], ED, [facility].  Discussed with patient electrolytes, glucose, TSH normal and no anemia noted.      Assessment & Plan:  A-seasonal allergic rhinitis due to pollen, ADHD predominantly inattentive type, BMI 30, bilateral effusion otic, need for tdap booster and dizziness on standing  P-Patient may use normal saline nasal spray 2 sprays each nostril q2h wa as needed given 1 bottle from clinic stock. flonase 1 spray each nostril BID #1 RF1 electronic Rx to his pharmacy of choice.  Patient denied personal or family history of ENT cancer.  OTC antihistamine of choice claritin 10mg  po daily OTC given 7 day supply from clinic stock.  Avoid triggers if possible.  Shower prior to bedtime during high pollen season.  If allergic dust/dust mites recommend mattress/pillow covers/encasements; washing linens, vacuuming, sweeping, dusting weekly.  Call or return to clinic as needed if these symptoms worsen or fail to improve as anticipated.   Exitcare handout on allergic rhinitis and sinus rinse printed and given to patient.   Patient verbalized understanding of instructions, agreed with plan of care and had no further questions at this time.   Discussed with patient otitis effusion secondary to post nasal drip probably causing vertigo  Meclizine already Rx by [facility] PCM and patient needs to pick up from pharmacy. Supportive treatment may take up to 4 doses 25mg  meclizine per day max 100mg  per 24 hours. Discussed signs/symptoms stroke.  Avoid ladders and driving if experiencing dizziness/notify supervisor.  Discussed meclizine can cause drowsiness test dose at home when he doesn't have to drive or go anywhere. Follow up if aphasia, dysphasia, visual changes, weakness, fall, worst headache of life, incoordination, fever, ear discharge. Consider ENT evaluation/follow up with PCM if worsening symptoms not controlled with meclizine.  Exitcare handout on vertigo printed and given to patient.  Discussed will take up to 30 days for middle ear fluid to resolve once post nasal drip stopped.   Patient verbalized understanding of information/agreed with plan of care and had no further questions at this time.  P2:  Avoidance and hand washing.  Discussed next refill will be available for pick up last day of month/first week of April as just filled 11 Apr 2019 by PA .  Refilled adderall XR 15mg  po BID #60 RF0 electronic Rx to patient pharmacy of choice.   Next face to face visit due 90 days sooner if side effects experienced discussed above/red flags. controlled substances agreement  on file at [facility] paper chart signed 03/30/2018  [place] PMP website reviewed and all Rx from [facility] [place] providers in previous 2 years. Last fill on 04/11/2019 from [facility].   Patient verbalized understanding information/instructions, agreed with plan of care and had no further questions at this time.  Encouraged exercise and weight loss to BMI less than 25  TDAP VIS printed and given to patient and TDAP IM administered by CMA  [place] today in clinic and patient observed in clinic 15 minutes without side effects.  Departed ambulatory in no distress.  Respirations even and unlabored.  Gait sure and steady in hallway.  Encouraged patient to see dentist annually for cleaning/exam and brush/floss after each meal and start listerine equivalent am/pm to help prevent worsening of dental caries.  Patient verbalized understanding information/instructions, agreed with plan of care and had no further questions at this time.

## 2019-05-17 ENCOUNTER — Other Ambulatory Visit: Payer: Self-pay

## 2019-05-17 DIAGNOSIS — H5203 Hypermetropia, bilateral: Secondary | ICD-10-CM | POA: Diagnosis not present

## 2019-05-17 DIAGNOSIS — F988 Other specified behavioral and emotional disorders with onset usually occurring in childhood and adolescence: Secondary | ICD-10-CM

## 2019-05-17 MED ORDER — AMPHETAMINE-DEXTROAMPHETAMINE 15 MG PO TABS: 15.0000 mg | ORAL_TABLET | Freq: Two times a day (BID) | ORAL | 0 refills | Status: DC

## 2019-05-23 DIAGNOSIS — R002 Palpitations: Secondary | ICD-10-CM | POA: Diagnosis not present

## 2019-05-23 DIAGNOSIS — R42 Dizziness and giddiness: Secondary | ICD-10-CM | POA: Diagnosis not present

## 2019-05-23 DIAGNOSIS — R69 Illness, unspecified: Secondary | ICD-10-CM | POA: Diagnosis not present

## 2019-06-23 DIAGNOSIS — R42 Dizziness and giddiness: Secondary | ICD-10-CM | POA: Diagnosis not present

## 2019-06-23 DIAGNOSIS — R002 Palpitations: Secondary | ICD-10-CM | POA: Diagnosis not present

## 2019-06-23 DIAGNOSIS — R079 Chest pain, unspecified: Secondary | ICD-10-CM | POA: Diagnosis not present

## 2019-06-23 DIAGNOSIS — R0789 Other chest pain: Secondary | ICD-10-CM | POA: Diagnosis not present

## 2019-06-23 DIAGNOSIS — R000 Tachycardia, unspecified: Secondary | ICD-10-CM | POA: Diagnosis not present

## 2019-07-05 DIAGNOSIS — R002 Palpitations: Secondary | ICD-10-CM | POA: Diagnosis not present

## 2019-07-05 DIAGNOSIS — R69 Illness, unspecified: Secondary | ICD-10-CM | POA: Diagnosis not present

## 2019-07-05 DIAGNOSIS — R42 Dizziness and giddiness: Secondary | ICD-10-CM | POA: Diagnosis not present

## 2019-07-05 DIAGNOSIS — R21 Rash and other nonspecific skin eruption: Secondary | ICD-10-CM | POA: Diagnosis not present

## 2019-09-19 ENCOUNTER — Emergency Department
Admission: EM | Admit: 2019-09-19 | Discharge: 2019-09-19 | Disposition: A | Discharge status: Home / Self Care | Payer: No Typology Code available for payment source | Attending: Emergency Medicine | Admitting: Emergency Medicine

## 2019-09-19 ENCOUNTER — Encounter: Payer: Self-pay | Admitting: Emergency Medicine

## 2019-09-19 ENCOUNTER — Emergency Department: Payer: No Typology Code available for payment source

## 2019-09-19 DIAGNOSIS — R079 Chest pain, unspecified: Secondary | ICD-10-CM | POA: Insufficient documentation

## 2019-09-19 DIAGNOSIS — R69 Illness, unspecified: Secondary | ICD-10-CM | POA: Diagnosis not present

## 2019-09-19 DIAGNOSIS — F909 Attention-deficit hyperactivity disorder, unspecified type: Secondary | ICD-10-CM | POA: Insufficient documentation

## 2019-09-19 DIAGNOSIS — R0789 Other chest pain: Secondary | ICD-10-CM | POA: Diagnosis not present

## 2019-09-19 LAB — FIBRIN DERIVATIVES D-DIMER (ARMC ONLY): Fibrin derivatives D-dimer (ARMC): 146.76 ng/mL (FEU) (ref 0.00–499.00)

## 2019-09-19 LAB — CBC
HCT: 38.2 % — ABNORMAL LOW (ref 39.0–52.0)
Hemoglobin: 13.1 g/dL (ref 13.0–17.0)
MCH: 31.1 pg (ref 26.0–34.0)
MCHC: 34.3 g/dL (ref 30.0–36.0)
MCV: 90.7 fL (ref 80.0–100.0)
Platelets: 277 10*3/uL (ref 150–400)
RBC: 4.21 MIL/uL — ABNORMAL LOW (ref 4.22–5.81)
RDW: 12.6 % (ref 11.5–15.5)
WBC: 16.8 10*3/uL — ABNORMAL HIGH (ref 4.0–10.5)
nRBC: 0 % (ref 0.0–0.2)

## 2019-09-19 LAB — BASIC METABOLIC PANEL
Anion gap: 14 (ref 5–15)
BUN: 13 mg/dL (ref 6–20)
CO2: 21 mmol/L — ABNORMAL LOW (ref 22–32)
Calcium: 8.8 mg/dL — ABNORMAL LOW (ref 8.9–10.3)
Chloride: 100 mmol/L (ref 98–111)
Creatinine, Ser: 1.28 mg/dL — ABNORMAL HIGH (ref 0.61–1.24)
GFR calc Af Amer: >60 mL/min (ref >60)
GFR calc non Af Amer: >60 mL/min (ref >60)
Glucose, Bld: 87 mg/dL (ref 70–99)
Potassium: 3.4 mmol/L — ABNORMAL LOW (ref 3.5–5.1)
Sodium: 135 mmol/L (ref 135–145)

## 2019-09-19 LAB — TROPONIN I (HIGH SENSITIVITY)
Troponin I (High Sensitivity): 15 ng/L (ref <18)
Troponin I (High Sensitivity): 16 ng/L (ref <18)

## 2019-09-19 MED ORDER — SODIUM CHLORIDE 0.9 % IV BOLUS
1000.0000 mL | Freq: Once | INTRAVENOUS | Status: AC
  Administered 2019-09-19: 1000 mL via INTRAVENOUS

## 2019-09-19 NOTE — Discharge Instructions (Addendum)
Please seek medical attention for any high fevers, chest pain, shortness of breath, change in behavior, persistent vomiting, bloody stool or any other new or concerning symptoms.  
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
## 2019-10-09 DIAGNOSIS — R42 Dizziness and giddiness: Secondary | ICD-10-CM | POA: Diagnosis not present

## 2019-10-09 DIAGNOSIS — R69 Illness, unspecified: Secondary | ICD-10-CM | POA: Diagnosis not present

## 2019-10-09 DIAGNOSIS — R21 Rash and other nonspecific skin eruption: Secondary | ICD-10-CM | POA: Diagnosis not present

## 2019-10-23 ENCOUNTER — Other Ambulatory Visit: Payer: 59

## 2019-10-23 DIAGNOSIS — Z1152 Encounter for screening for COVID-19: Secondary | ICD-10-CM

## 2019-10-23 NOTE — Progress Notes (Signed)
Pt presents today with covid symptoms, no taste or smell, head ache, body aches and fever. Symptoms started 10/20/19. CL,RMA

## 2019-10-25 LAB — SARS-COV-2, NAA 2 DAY TAT

## 2019-10-25 LAB — NOVEL CORONAVIRUS, NAA: SARS-CoV-2, NAA: DETECTED — AB

## 2020-01-03 ENCOUNTER — Ambulatory Visit: Payer: Self-pay

## 2020-01-03 ENCOUNTER — Other Ambulatory Visit: Payer: Self-pay

## 2020-01-03 DIAGNOSIS — Z0000 Encounter for general adult medical examination without abnormal findings: Secondary | ICD-10-CM

## 2020-01-03 LAB — POCT URINALYSIS DIPSTICK
Bilirubin, UA: NEGATIVE
Blood, UA: NEGATIVE
Glucose, UA: NEGATIVE
Ketones, UA: NEGATIVE
Leukocytes, UA: NEGATIVE
Nitrite, UA: NEGATIVE
Protein, UA: NEGATIVE
Spec Grav, UA: 1.015 (ref 1.010–1.025)
Urobilinogen, UA: 0.2 E.U./dL
pH, UA: 6 (ref 5.0–8.0)

## 2020-01-03 NOTE — Progress Notes (Signed)
Scheduled to complete physical 01/09/20 with Durward Parcel, PA-C.  AMD

## 2020-01-04 LAB — CMP12+LP+TP+TSH+6AC+CBC/D/PLT
ALT: 22 IU/L (ref 0–44)
AST: 16 IU/L (ref 0–40)
Albumin/Globulin Ratio: 1.7 (ref 1.2–2.2)
Albumin: 4.8 g/dL (ref 4.0–5.0)
Alkaline Phosphatase: 67 IU/L (ref 44–121)
BUN/Creatinine Ratio: 13 (ref 9–20)
BUN: 13 mg/dL (ref 6–20)
Basophils Absolute: 0.1 10*3/uL (ref 0.0–0.2)
Basos: 1 %
Bilirubin Total: 0.5 mg/dL (ref 0.0–1.2)
Calcium: 9.6 mg/dL (ref 8.7–10.2)
Chloride: 98 mmol/L (ref 96–106)
Chol/HDL Ratio: 3.4 ratio (ref 0.0–5.0)
Cholesterol, Total: 162 mg/dL (ref 100–199)
Creatinine, Ser: 1 mg/dL (ref 0.76–1.27)
EOS (ABSOLUTE): 0.1 10*3/uL (ref 0.0–0.4)
Eos: 2 %
Estimated CHD Risk: 0.5 times avg. (ref 0.0–1.0)
Free Thyroxine Index: 2 (ref 1.2–4.9)
GFR calc Af Amer: 115 mL/min/{1.73_m2} (ref >59)
GFR calc non Af Amer: 99 mL/min/{1.73_m2} (ref >59)
GGT: 18 IU/L (ref 0–65)
Globulin, Total: 2.8 g/dL (ref 1.5–4.5)
Glucose: 99 mg/dL (ref 65–99)
HDL: 47 mg/dL (ref >39)
Hematocrit: 42.2 % (ref 37.5–51.0)
Hemoglobin: 14.6 g/dL (ref 13.0–17.7)
Immature Grans (Abs): 0 10*3/uL (ref 0.0–0.1)
Immature Granulocytes: 0 %
Iron: 54 ug/dL (ref 38–169)
LDH: 180 IU/L (ref 121–224)
LDL Chol Calc (NIH): 103 mg/dL — ABNORMAL HIGH (ref 0–99)
Lymphocytes Absolute: 2.2 10*3/uL (ref 0.7–3.1)
Lymphs: 29 %
MCH: 31.6 pg (ref 26.6–33.0)
MCHC: 34.6 g/dL (ref 31.5–35.7)
MCV: 91 fL (ref 79–97)
Monocytes Absolute: 0.7 10*3/uL (ref 0.1–0.9)
Monocytes: 9 %
Neutrophils Absolute: 4.5 10*3/uL (ref 1.4–7.0)
Neutrophils: 59 %
Phosphorus: 2.9 mg/dL (ref 2.8–4.1)
Platelets: 297 10*3/uL (ref 150–450)
Potassium: 4.2 mmol/L (ref 3.5–5.2)
RBC: 4.62 x10E6/uL (ref 4.14–5.80)
RDW: 13 % (ref 11.6–15.4)
Sodium: 138 mmol/L (ref 134–144)
T3 Uptake Ratio: 30 % (ref 24–39)
T4, Total: 6.7 ug/dL (ref 4.5–12.0)
TSH: 1.81 u[IU]/mL (ref 0.450–4.500)
Total Protein: 7.6 g/dL (ref 6.0–8.5)
Triglycerides: 61 mg/dL (ref 0–149)
Uric Acid: 6.4 mg/dL (ref 3.8–8.4)
VLDL Cholesterol Cal: 12 mg/dL (ref 5–40)
WBC: 7.6 10*3/uL (ref 3.4–10.8)

## 2020-01-07 LAB — QUANTIFERON-TB GOLD PLUS
QuantiFERON Mitogen Value: >10 IU/mL
QuantiFERON Nil Value: 0.13 IU/mL
QuantiFERON TB1 Ag Value: 0.12 IU/mL
QuantiFERON TB2 Ag Value: 0.29 IU/mL
QuantiFERON-TB Gold Plus: NEGATIVE

## 2020-01-09 ENCOUNTER — Ambulatory Visit: Payer: Self-pay | Admitting: Physician Assistant

## 2020-01-09 ENCOUNTER — Encounter: Payer: Self-pay | Admitting: Physician Assistant

## 2020-01-09 ENCOUNTER — Other Ambulatory Visit: Payer: Self-pay

## 2020-01-09 VITALS — BP 117/78 | HR 89 | Temp 97.5°F | Resp 12 | Ht 72.0 in | Wt 232.0 lb

## 2020-01-09 DIAGNOSIS — Z0000 Encounter for general adult medical examination without abnormal findings: Secondary | ICD-10-CM

## 2020-01-09 MED ORDER — CLOTRIMAZOLE-BETAMETHASONE 1-0.05 % EX CREA
TOPICAL_CREAM | Freq: Two times a day (BID) | CUTANEOUS | 3 refills | Status: DC
Start: 2020-01-09 — End: 2022-01-14

## 2020-01-09 NOTE — Progress Notes (Signed)
° °  Subjective: Annual firefighter physical    Patient ID: Keith Wiggins, male    DOB: October 23, 1985, 34 y.o.   MRN: [id number]  HPI Patient presents for annual firefighter physical.  Patient most concerned for rash that is in his belt line.   Review of Systems    Rash Objective:   Physical Exam No acute distress.  HEENT is unremarkable.  Neck is supple for adenopathy or bruits.  Lungs are clear to auscultation.  Heart regular rate and rhythm.  Abdomen with negative HSM, normoactive bowel sounds, soft, nontender to palpation.  No obvious deformity to the upper or lower extremities.  Patient has full and equal range of motion of the upper and lower extremities.  No obvious deformity to the cervical lumbar spine.  Patient has full and equal range of motion of the cervical lumbar spine.  Patient has flaky rash to the belt line circumference.  Cranial nerves II through XII grossly intact.       Assessment & Plan: Well exam.  Discussed lab results with patient.  Patient given prescription for Lotrisone.  Advised to follow-up as needed.

## 2020-04-08 DIAGNOSIS — Z8719 Personal history of other diseases of the digestive system: Secondary | ICD-10-CM | POA: Diagnosis not present

## 2020-04-08 DIAGNOSIS — R1032 Left lower quadrant pain: Secondary | ICD-10-CM | POA: Diagnosis not present

## 2020-04-11 DIAGNOSIS — R109 Unspecified abdominal pain: Secondary | ICD-10-CM | POA: Diagnosis not present

## 2020-04-11 DIAGNOSIS — R69 Illness, unspecified: Secondary | ICD-10-CM | POA: Diagnosis not present

## 2020-04-11 DIAGNOSIS — R1032 Left lower quadrant pain: Secondary | ICD-10-CM | POA: Diagnosis not present

## 2020-04-23 DIAGNOSIS — R109 Unspecified abdominal pain: Secondary | ICD-10-CM | POA: Diagnosis not present

## 2020-04-24 DIAGNOSIS — R1032 Left lower quadrant pain: Secondary | ICD-10-CM | POA: Diagnosis not present

## 2020-04-26 ENCOUNTER — Other Ambulatory Visit: Payer: Self-pay | Admitting: Infectious Diseases

## 2020-04-26 DIAGNOSIS — R1032 Left lower quadrant pain: Secondary | ICD-10-CM

## 2020-05-10 DIAGNOSIS — K449 Diaphragmatic hernia without obstruction or gangrene: Secondary | ICD-10-CM | POA: Diagnosis not present

## 2020-05-10 DIAGNOSIS — R1032 Left lower quadrant pain: Secondary | ICD-10-CM | POA: Diagnosis not present

## 2020-05-10 DIAGNOSIS — R911 Solitary pulmonary nodule: Secondary | ICD-10-CM | POA: Diagnosis not present

## 2020-05-14 ENCOUNTER — Ambulatory Visit: Payer: 59

## 2020-09-03 IMAGING — US US EXTREM LOW VENOUS*R*
1 series · 13 of 24 positions shown · non-contrast
Comparison: None.

CLINICAL DATA: Swelling involving the calf for the past 3 months.
Evaluate for DVT.



[Series 1: us extrem low venous*right* · 13 of 34 slices shown]
[im 1/34]
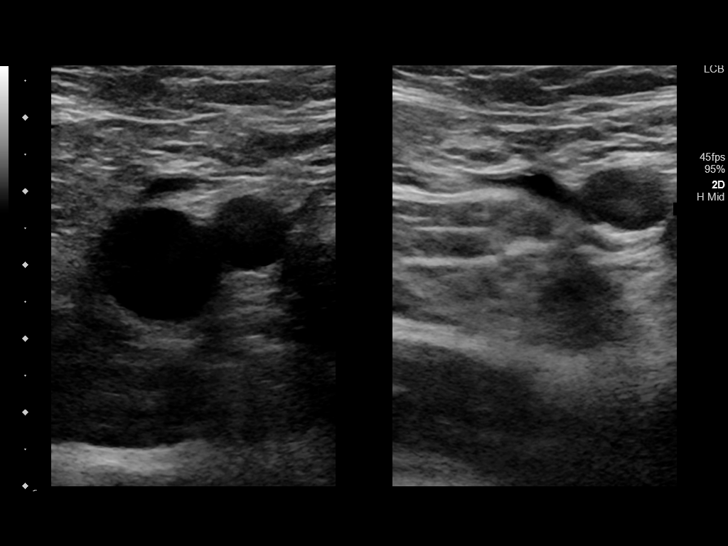
[im 3/34]
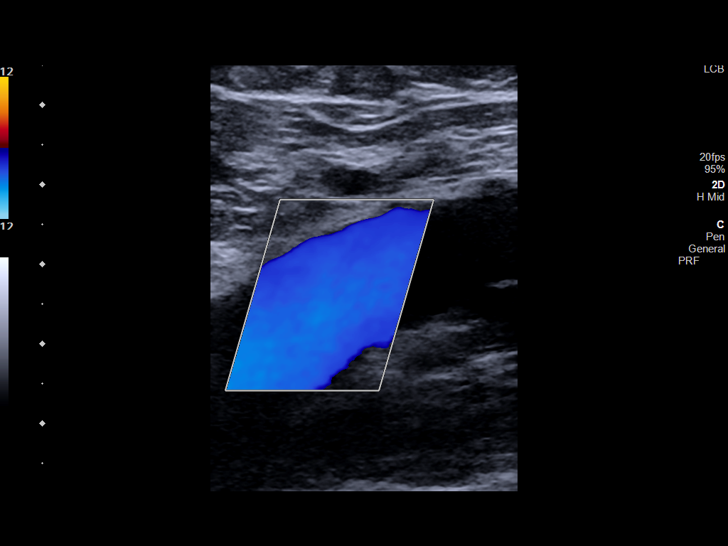
[im 6/34]
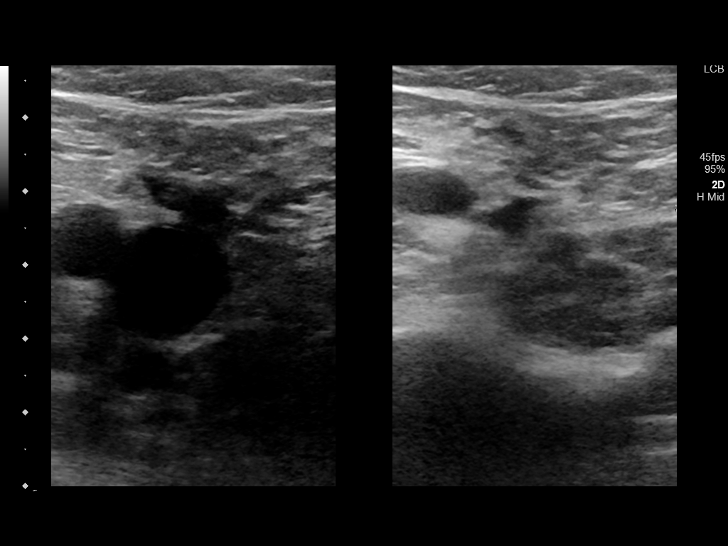
[im 9/34]
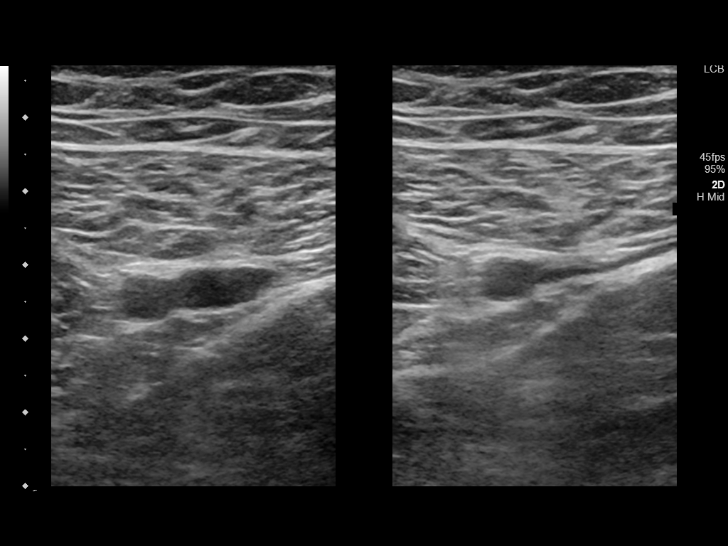
[im 12/34]
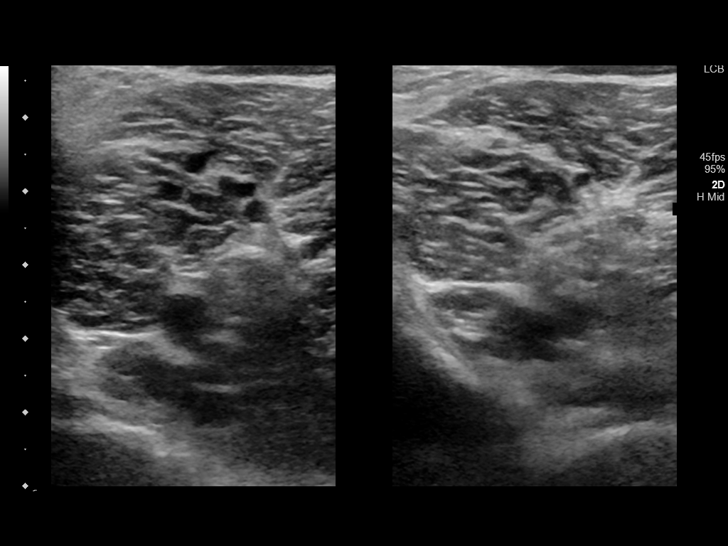
[im 15/34]
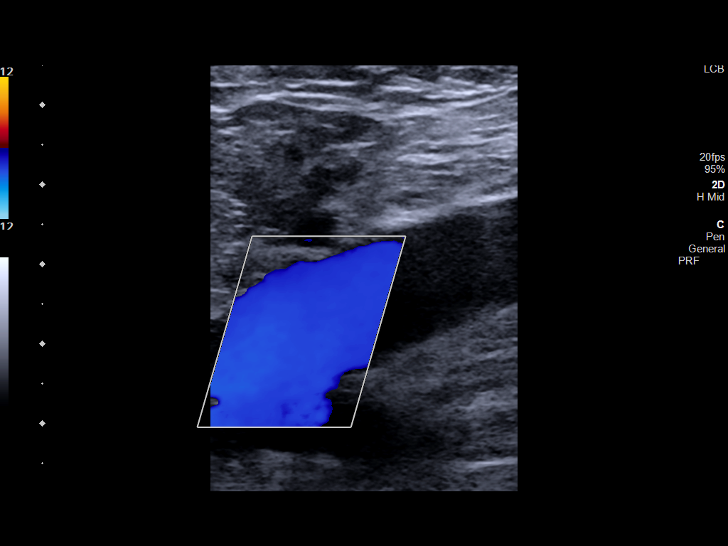
[im 18/34]
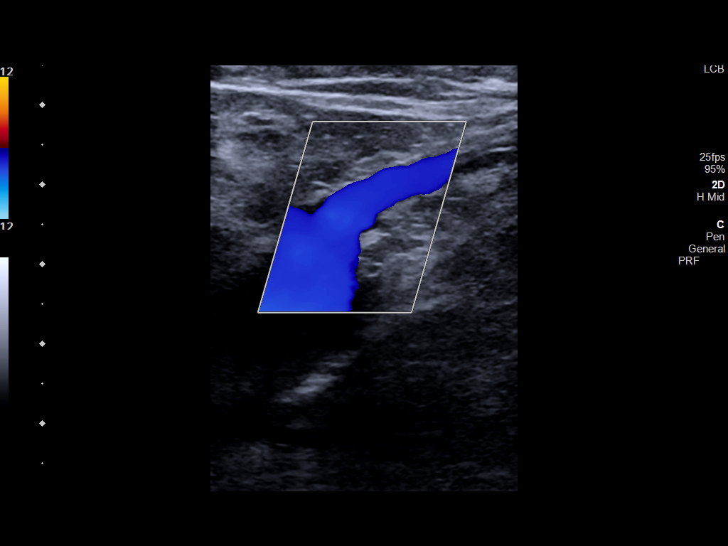
[im 19/34]
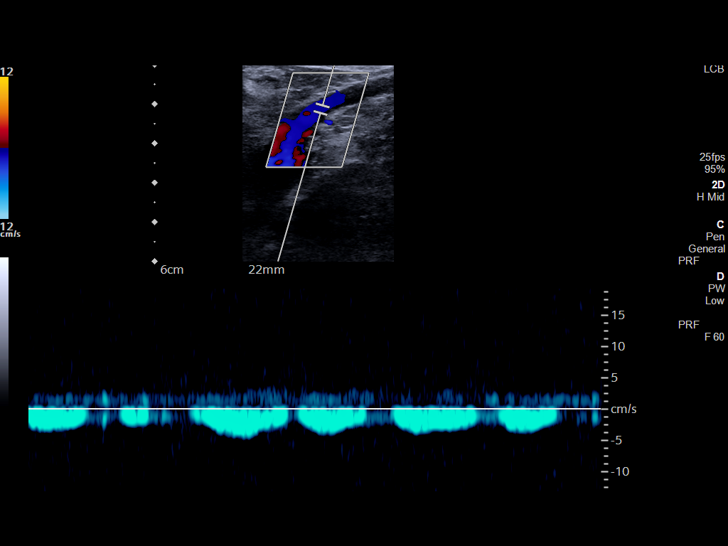
[im 22/34]
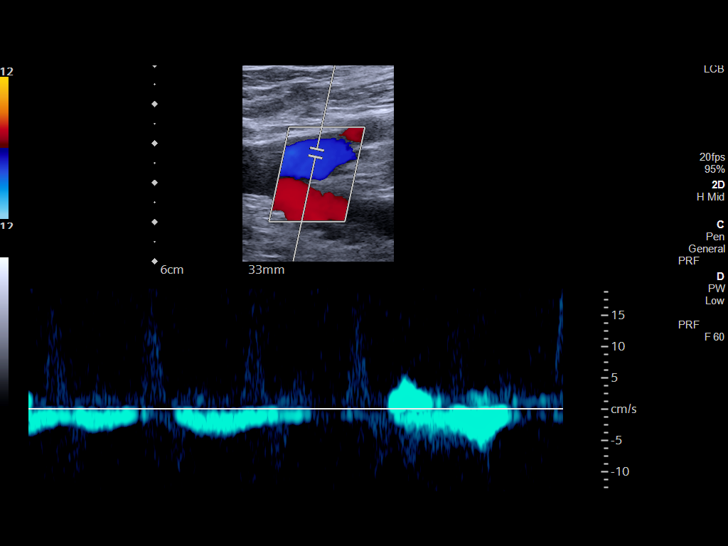
[im 25/34]
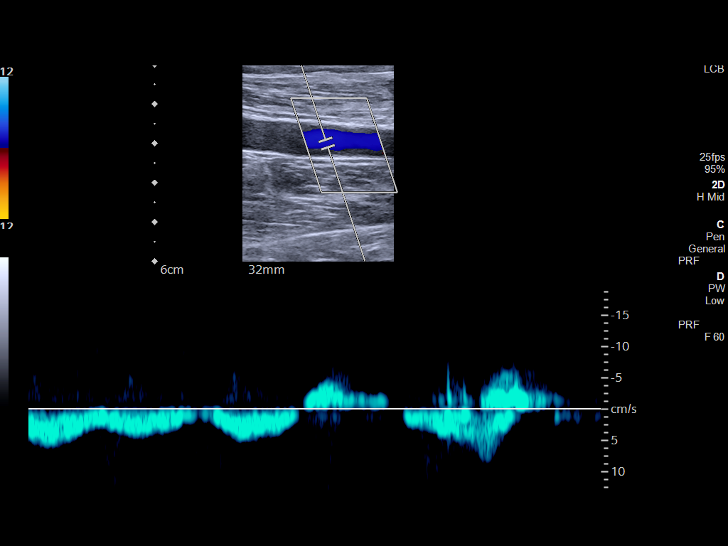
[im 28/34]
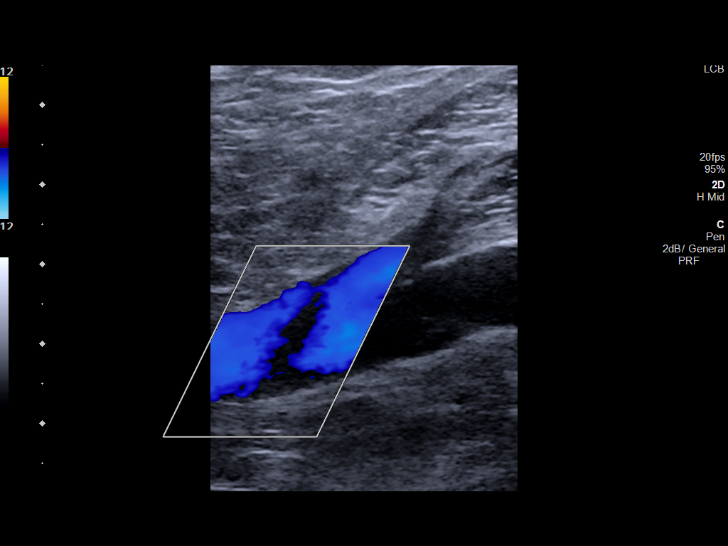
[im 31/34]
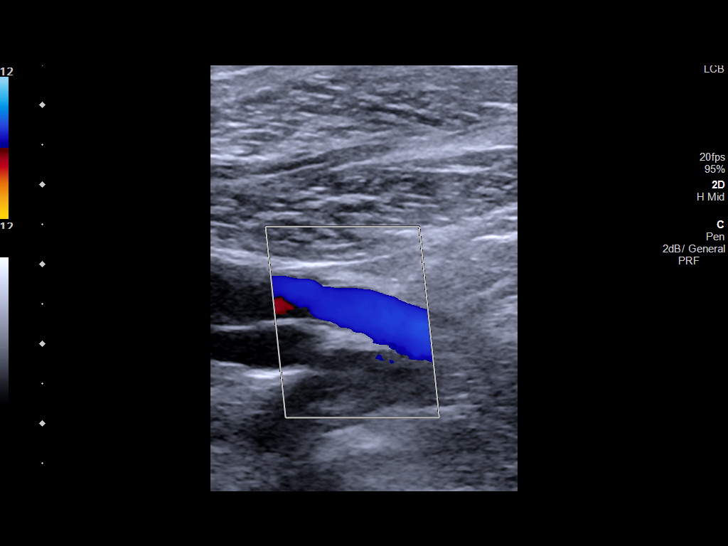
[im 34/34]
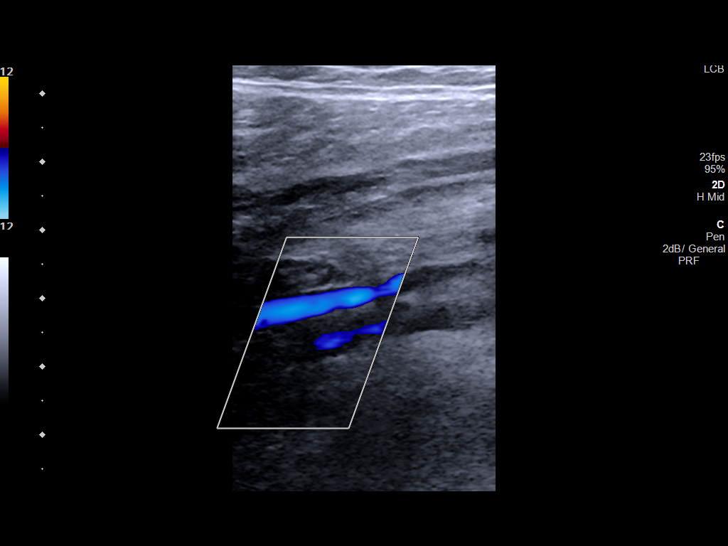

[13 of 24 positions shown; findings below may reference images not displayed]

FINDINGS: Contralateral Common Femoral Vein: Respiratory phasicity is normal
and symmetric with the symptomatic side. No evidence of thrombus.
Normal compressibility.

Common Femoral Vein: No evidence of thrombus. Normal
compressibility, respiratory phasicity and response to augmentation.

Saphenofemoral Junction: No evidence of thrombus. Normal
compressibility and flow on color Doppler imaging.

Profunda Femoral Vein: No evidence of thrombus. Normal
compressibility and flow on color Doppler imaging.

Femoral Vein: No evidence of thrombus. Normal compressibility,
respiratory phasicity and response to augmentation.

Popliteal Vein: No evidence of thrombus. Normal compressibility,
respiratory phasicity and response to augmentation.

Calf Veins: No evidence of thrombus. Normal compressibility and flow
on color Doppler imaging.

Superficial Great Saphenous Vein: No evidence of thrombus. Normal
compressibility.

Venous Reflux:  None.

Other Findings:  None.
IMPRESSION: No evidence of DVT within the right lower extremity.

## 2020-09-03 IMAGING — DX DG CHEST 1V PORT
1 series · 1 of 1 positions shown · non-contrast
Comparison: CT abdomen pelvis dated 09/02/2014

CLINICAL DATA: Chest pain

EXAM:
PORTABLE CHEST 1 VIEW

[chest ap]
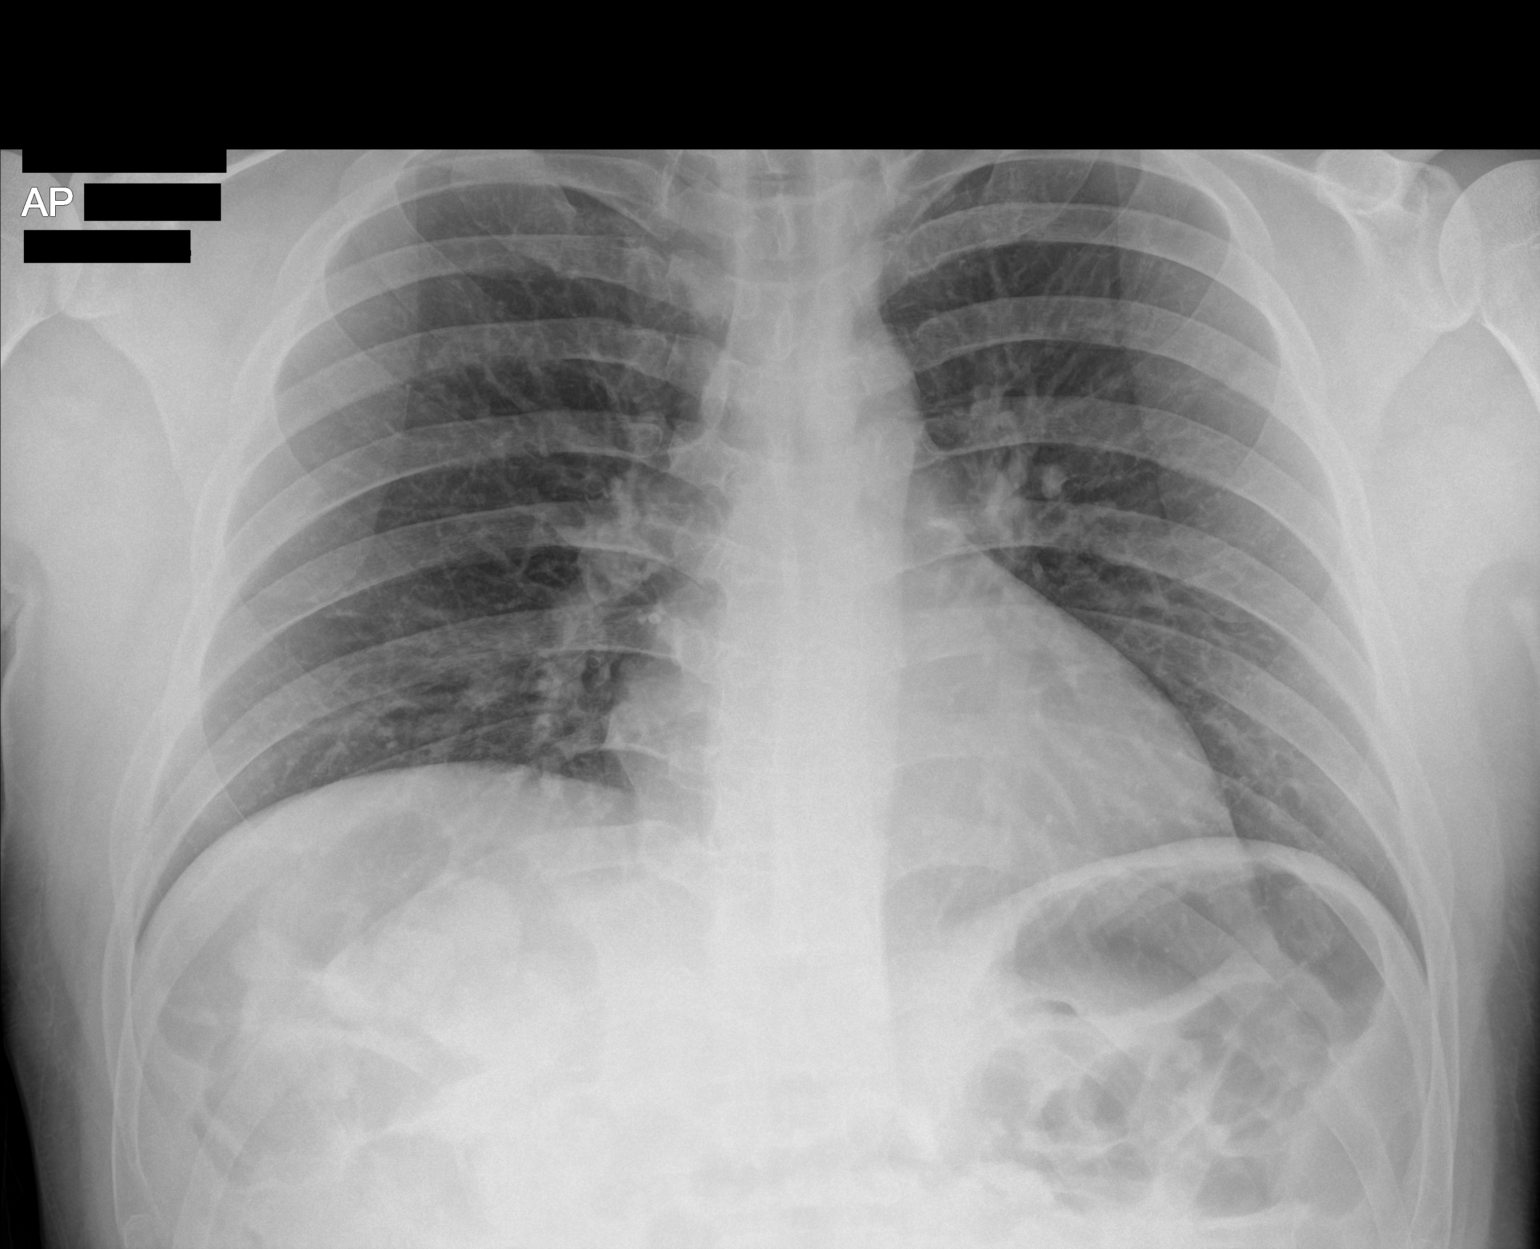

[1 of 1 positions shown; findings below may reference images not displayed]

FINDINGS: The heart size and mediastinal contours are within normal limits.
Both lungs are clear. The visualized skeletal structures are
unremarkable.
IMPRESSION: No active disease.

## 2020-10-15 DIAGNOSIS — R69 Illness, unspecified: Secondary | ICD-10-CM | POA: Diagnosis not present

## 2021-01-10 ENCOUNTER — Ambulatory Visit: Payer: Self-pay

## 2021-01-10 ENCOUNTER — Other Ambulatory Visit: Payer: Self-pay

## 2021-01-10 DIAGNOSIS — Z0289 Encounter for other administrative examinations: Secondary | ICD-10-CM

## 2021-01-10 DIAGNOSIS — Z0000 Encounter for general adult medical examination without abnormal findings: Secondary | ICD-10-CM

## 2021-01-10 LAB — POCT URINALYSIS DIPSTICK
Bilirubin, UA: NEGATIVE
Blood, UA: NEGATIVE
Glucose, UA: NEGATIVE
Ketones, UA: NEGATIVE
Leukocytes, UA: NEGATIVE
Nitrite, UA: NEGATIVE
Protein, UA: NEGATIVE
Spec Grav, UA: 1.025 (ref 1.010–1.025)
Urobilinogen, UA: 0.2 E.U./dL
pH, UA: 6 (ref 5.0–8.0)

## 2021-01-10 NOTE — Progress Notes (Signed)
01/16/21 annual physical scheduled.   Reviewed CDC recommendations for importance of HIV/ Hep C screening once in lifetime. Patient has declined HIV / Hep C screenings today and will let us know if they should change their mind in the future.

## 2021-01-11 LAB — CMP12+LP+TP+TSH+6AC+CBC/D/PLT
ALT: 29 IU/L (ref 0–44)
AST: 19 IU/L (ref 0–40)
Albumin/Globulin Ratio: 1.9 (ref 1.2–2.2)
Albumin: 4.9 g/dL (ref 4.0–5.0)
Alkaline Phosphatase: 72 IU/L (ref 44–121)
BUN/Creatinine Ratio: 12 (ref 9–20)
BUN: 12 mg/dL (ref 6–20)
Basophils Absolute: 0.1 10*3/uL (ref 0.0–0.2)
Basos: 1 %
Bilirubin Total: 0.5 mg/dL (ref 0.0–1.2)
Calcium: 9.3 mg/dL (ref 8.7–10.2)
Chloride: 101 mmol/L (ref 96–106)
Chol/HDL Ratio: 3.3 ratio (ref 0.0–5.0)
Cholesterol, Total: 172 mg/dL (ref 100–199)
Creatinine, Ser: 1.02 mg/dL (ref 0.76–1.27)
EOS (ABSOLUTE): 0.2 10*3/uL (ref 0.0–0.4)
Eos: 3 %
Estimated CHD Risk: <0.5 times avg. (ref 0.0–1.0)
Free Thyroxine Index: 1.9 (ref 1.2–4.9)
GGT: 18 IU/L (ref 0–65)
Globulin, Total: 2.6 g/dL (ref 1.5–4.5)
Glucose: 95 mg/dL (ref 70–99)
HDL: 52 mg/dL (ref >39)
Hematocrit: 44.3 % (ref 37.5–51.0)
Hemoglobin: 15.1 g/dL (ref 13.0–17.7)
Immature Grans (Abs): 0 10*3/uL (ref 0.0–0.1)
Immature Granulocytes: 0 %
Iron: 74 ug/dL (ref 38–169)
LDH: 172 IU/L (ref 121–224)
LDL Chol Calc (NIH): 106 mg/dL — ABNORMAL HIGH (ref 0–99)
Lymphocytes Absolute: 2.5 10*3/uL (ref 0.7–3.1)
Lymphs: 36 %
MCH: 31 pg (ref 26.6–33.0)
MCHC: 34.1 g/dL (ref 31.5–35.7)
MCV: 91 fL (ref 79–97)
Monocytes Absolute: 0.6 10*3/uL (ref 0.1–0.9)
Monocytes: 8 %
Neutrophils Absolute: 3.7 10*3/uL (ref 1.4–7.0)
Neutrophils: 52 %
Phosphorus: 3.2 mg/dL (ref 2.8–4.1)
Platelets: 335 10*3/uL (ref 150–450)
Potassium: 4.1 mmol/L (ref 3.5–5.2)
RBC: 4.87 x10E6/uL (ref 4.14–5.80)
RDW: 12.5 % (ref 11.6–15.4)
Sodium: 138 mmol/L (ref 134–144)
T3 Uptake Ratio: 25 % (ref 24–39)
T4, Total: 7.4 ug/dL (ref 4.5–12.0)
TSH: 2.75 u[IU]/mL (ref 0.450–4.500)
Total Protein: 7.5 g/dL (ref 6.0–8.5)
Triglycerides: 71 mg/dL (ref 0–149)
Uric Acid: 6.3 mg/dL (ref 3.8–8.4)
VLDL Cholesterol Cal: 14 mg/dL (ref 5–40)
WBC: 7 10*3/uL (ref 3.4–10.8)
eGFR: 100 mL/min/{1.73_m2} (ref >59)

## 2021-01-16 ENCOUNTER — Ambulatory Visit: Payer: 59 | Admitting: Physician Assistant

## 2021-01-21 ENCOUNTER — Encounter: Payer: Self-pay | Admitting: Physician Assistant

## 2021-01-21 ENCOUNTER — Ambulatory Visit: Payer: Self-pay | Admitting: Physician Assistant

## 2021-01-21 ENCOUNTER — Other Ambulatory Visit: Payer: Self-pay

## 2021-01-21 VITALS — BP 131/78 | HR 84 | Temp 97.8°F | Resp 12 | Ht 72.0 in | Wt 238.0 lb

## 2021-01-21 DIAGNOSIS — Z0289 Encounter for other administrative examinations: Secondary | ICD-10-CM

## 2021-01-21 NOTE — Progress Notes (Signed)
   Subjective: Annual firefighter exam    Patient ID: Keith Wiggins, male    DOB: 05/29/1985, 35 y.o.   MRN: [id number]  HPI Patient presented annual firefighter exam and was no concerns or complaints.   Review of Systems ADHD controlled with Adderall    Objective:   Physical Exam No acute distress.  Temperature 97.8, pulse 84, respiration 12, BP is 131/78, patient 99% O2 sat on room air.  Patient weighs 2 and 30 pounds BMI is 32.3. HEENT is unremarkable.  Neck is supple without lymphadenopathy or bruits.  Lungs are clear to auscultation.  Heart regular rate and rhythm.  No acute findings on EKG. Abdomen with negative HSM, normoactive bowel sounds, soft, nontender to palpation. No obvious deformity to the upper or lower extremities.  Patient has full and equal range of motion of the upper and lower extremities. No obvious cervical or lumbar spine deformity.  Patient has full and equal range of motion cervical lumbar spine. Cranial nerves II through XII are grossly intact.  DTR 2+ without clonus.       Assessment & Plan: Well exam.   Discussed no acute findings on lab results with patient.  Patient will follow-up as necessary.

## 2021-04-10 DIAGNOSIS — R69 Illness, unspecified: Secondary | ICD-10-CM | POA: Diagnosis not present

## 2021-05-09 IMAGING — DX DG CHEST 1V PORT
1 series · 1 of 1 positions shown · non-contrast
Comparison: 01/14/2019 chest radiograph.

CLINICAL DATA: Chest pain

EXAM:
PORTABLE CHEST 1 VIEW

[chest ap]
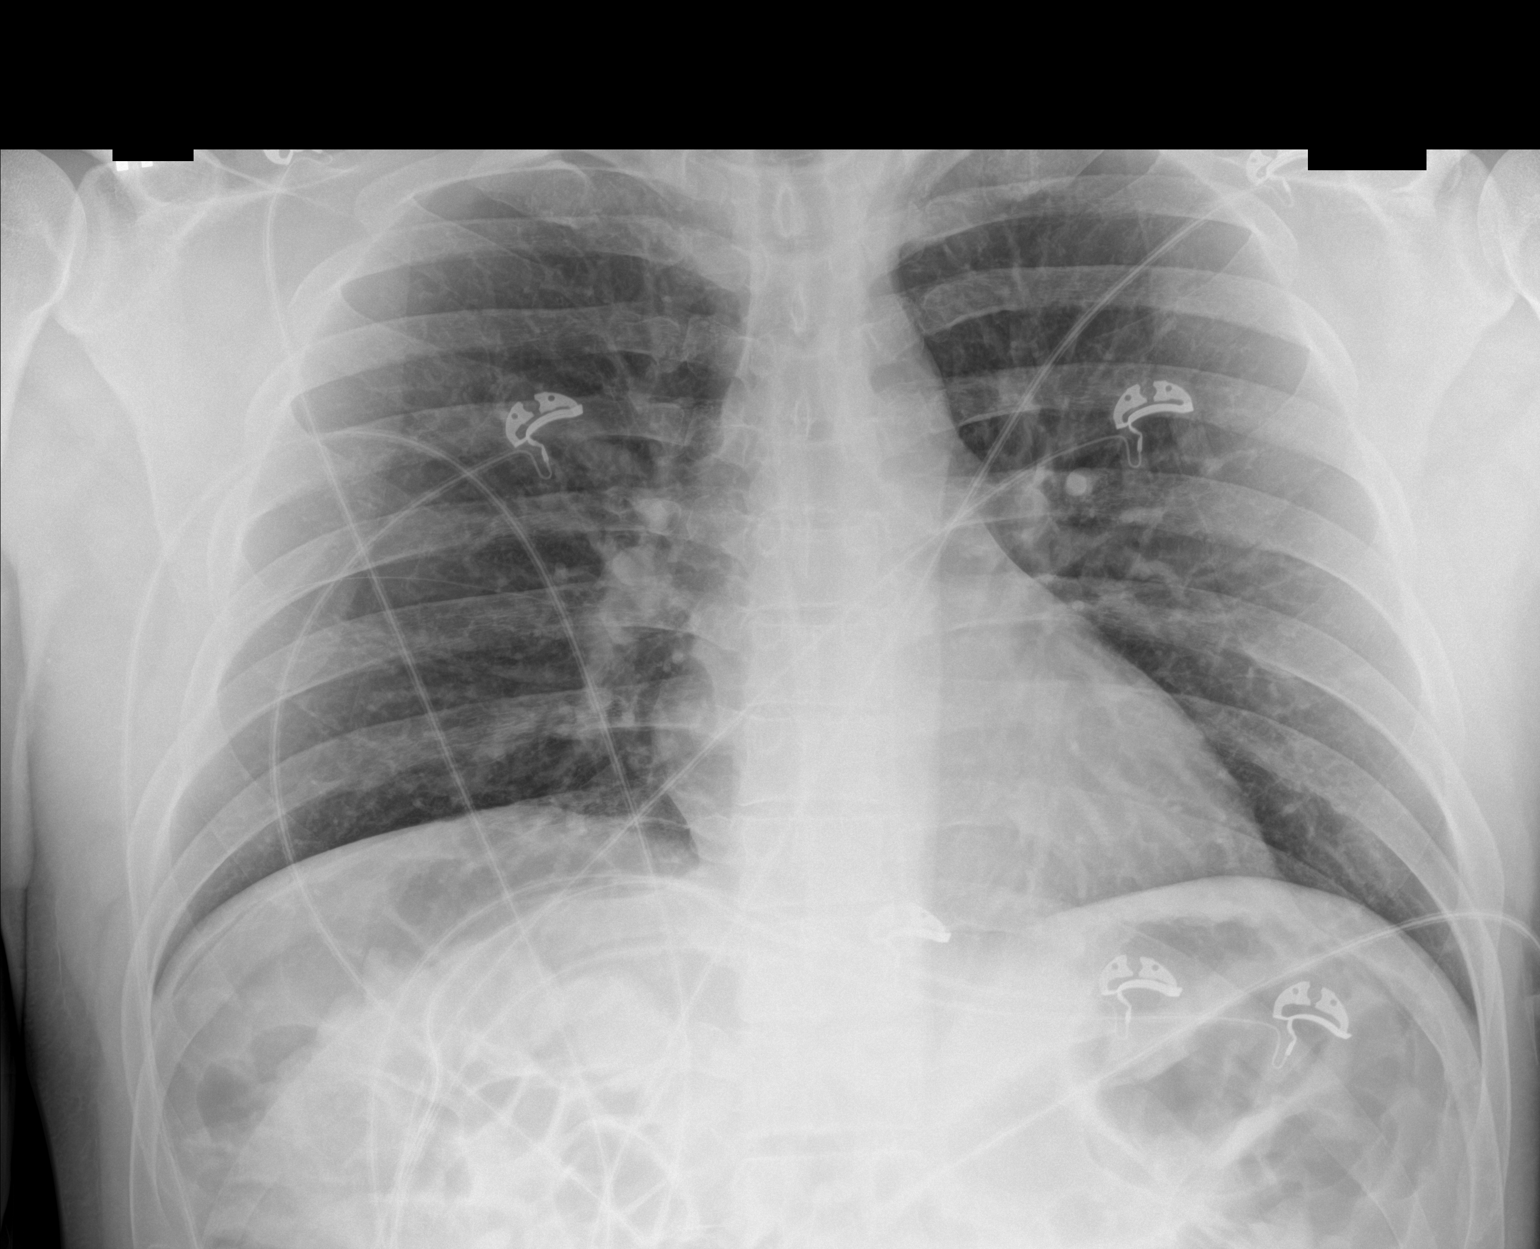

[1 of 1 positions shown; findings below may reference images not displayed]

FINDINGS: Cardiomediastinal silhouette within normal limits. Both lungs are
clear. No pneumothorax or pleural effusion. No acute osseous
abnormality. Telemetry wires overlie the chest.
IMPRESSION: No focal airspace disease.

## 2021-05-20 ENCOUNTER — Other Ambulatory Visit: Payer: Self-pay

## 2021-05-20 DIAGNOSIS — Z0283 Encounter for blood-alcohol and blood-drug test: Secondary | ICD-10-CM

## 2021-05-20 NOTE — Progress Notes (Signed)
Presents to [facility] for random drug screen & breath alcohol screen. ? ?[facility] Acct #:  [id number] ?[facility] Specimen #:  [id number] ? ?Breath Alcohol Screen = .000 ? ?AMD ?

## 2021-09-17 DIAGNOSIS — M542 Cervicalgia: Secondary | ICD-10-CM | POA: Diagnosis not present

## 2021-11-14 DIAGNOSIS — L089 Local infection of the skin and subcutaneous tissue, unspecified: Secondary | ICD-10-CM | POA: Diagnosis not present

## 2021-11-14 DIAGNOSIS — L729 Follicular cyst of the skin and subcutaneous tissue, unspecified: Secondary | ICD-10-CM | POA: Diagnosis not present

## 2021-12-03 DIAGNOSIS — L720 Epidermal cyst: Secondary | ICD-10-CM | POA: Diagnosis not present

## 2021-12-29 ENCOUNTER — Ambulatory Visit: Payer: 59 | Admitting: Physician Assistant

## 2022-01-05 ENCOUNTER — Ambulatory Visit: Payer: Self-pay

## 2022-01-05 DIAGNOSIS — Z0289 Encounter for other administrative examinations: Secondary | ICD-10-CM

## 2022-01-05 LAB — POCT URINALYSIS DIPSTICK
Bilirubin, UA: NEGATIVE
Blood, UA: NEGATIVE
Glucose, UA: NEGATIVE
Ketones, UA: NEGATIVE
Leukocytes, UA: NEGATIVE
Nitrite, UA: NEGATIVE
Protein, UA: NEGATIVE
Spec Grav, UA: 1.025 (ref 1.010–1.025)
Urobilinogen, UA: 0.2 E.U./dL
pH, UA: 6 (ref 5.0–8.0)

## 2022-01-05 NOTE — Progress Notes (Signed)
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

## 2022-01-06 LAB — CMP12+LP+TP+TSH+6AC+CBC/D/PLT
ALT: 30 IU/L (ref 0–44)
AST: 22 IU/L (ref 0–40)
Albumin/Globulin Ratio: 1.9 (ref 1.2–2.2)
Albumin: 4.8 g/dL (ref 4.1–5.1)
Alkaline Phosphatase: 83 IU/L (ref 44–121)
BUN/Creatinine Ratio: 12 (ref 9–20)
BUN: 12 mg/dL (ref 6–20)
Basophils Absolute: 0.1 10*3/uL (ref 0.0–0.2)
Basos: 1 %
Bilirubin Total: 0.5 mg/dL (ref 0.0–1.2)
Calcium: 9.5 mg/dL (ref 8.7–10.2)
Chloride: 101 mmol/L (ref 96–106)
Chol/HDL Ratio: 3.3 ratio (ref 0.0–5.0)
Cholesterol, Total: 181 mg/dL (ref 100–199)
Creatinine, Ser: 0.98 mg/dL (ref 0.76–1.27)
EOS (ABSOLUTE): 0.3 10*3/uL (ref 0.0–0.4)
Eos: 3 %
Estimated CHD Risk: <0.5 times avg. (ref 0.0–1.0)
Free Thyroxine Index: 1.8 (ref 1.2–4.9)
GGT: 17 IU/L (ref 0–65)
Globulin, Total: 2.5 g/dL (ref 1.5–4.5)
Glucose: 94 mg/dL (ref 70–99)
HDL: 55 mg/dL (ref >39)
Hematocrit: 43 % (ref 37.5–51.0)
Hemoglobin: 14.6 g/dL (ref 13.0–17.7)
Immature Grans (Abs): 0 10*3/uL (ref 0.0–0.1)
Immature Granulocytes: 0 %
Iron: 80 ug/dL (ref 38–169)
LDH: 178 IU/L (ref 121–224)
LDL Chol Calc (NIH): 109 mg/dL — ABNORMAL HIGH (ref 0–99)
Lymphocytes Absolute: 2.7 10*3/uL (ref 0.7–3.1)
Lymphs: 32 %
MCH: 30.8 pg (ref 26.6–33.0)
MCHC: 34 g/dL (ref 31.5–35.7)
MCV: 91 fL (ref 79–97)
Monocytes Absolute: 0.8 10*3/uL (ref 0.1–0.9)
Monocytes: 9 %
Neutrophils Absolute: 4.5 10*3/uL (ref 1.4–7.0)
Neutrophils: 55 %
Phosphorus: 3.5 mg/dL (ref 2.8–4.1)
Platelets: 304 10*3/uL (ref 150–450)
Potassium: 4.4 mmol/L (ref 3.5–5.2)
RBC: 4.74 x10E6/uL (ref 4.14–5.80)
RDW: 11.9 % (ref 11.6–15.4)
Sodium: 136 mmol/L (ref 134–144)
T3 Uptake Ratio: 27 % (ref 24–39)
T4, Total: 6.5 ug/dL (ref 4.5–12.0)
TSH: 2.26 u[IU]/mL (ref 0.450–4.500)
Total Protein: 7.3 g/dL (ref 6.0–8.5)
Triglycerides: 93 mg/dL (ref 0–149)
Uric Acid: 6.2 mg/dL (ref 3.8–8.4)
VLDL Cholesterol Cal: 17 mg/dL (ref 5–40)
WBC: 8.3 10*3/uL (ref 3.4–10.8)
eGFR: 104 mL/min/{1.73_m2} (ref >59)

## 2022-01-13 ENCOUNTER — Encounter: Payer: Self-pay | Admitting: Physician Assistant

## 2022-01-14 ENCOUNTER — Encounter: Payer: 59 | Admitting: Physician Assistant

## 2022-01-14 ENCOUNTER — Ambulatory Visit: Payer: Self-pay | Admitting: Physician Assistant

## 2022-01-14 ENCOUNTER — Encounter: Payer: Self-pay | Admitting: Physician Assistant

## 2022-01-14 VITALS — BP 130/83 | HR 101 | Temp 97.6°F | Resp 12 | Ht 72.0 in | Wt 230.0 lb

## 2022-01-14 DIAGNOSIS — Z0000 Encounter for general adult medical examination without abnormal findings: Secondary | ICD-10-CM

## 2022-01-14 MED ORDER — DICLOFENAC SODIUM 1 % EX GEL: 4.0000 g | Freq: Four times a day (QID) | CUTANEOUS | 1 refills | Status: DC

## 2022-01-14 NOTE — Progress Notes (Signed)
[facility]  ____________________________________________   None    (approximate)  I have reviewed the triage vital signs and the nursing notes.   HISTORY  Chief Complaint Employment Physical   HPI Keith Wiggins is a 36 y.o. 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  Patient voiced concern for mid right lower leg pain.  Patient states pain along the shin for approximately 2 to 3 weeks.  Patient states he noticed intermitting edema.  No loss of sensation or loss of function.         Past Medical History:  Diagnosis Date   ADD (attention deficit disorder)    Arm fracture, left    Dyspepsia    Genital warts    Overweight     Patient Active Problem List   Diagnosis Date Noted   Attention deficit hyperactivity disorder (ADHD), predominantly inattentive type 10/25/2018    No past surgical history on file.  Prior to Admission medications   Medication Sig Start Date End Date Taking? Authorizing Provider  diclofenac Sodium (VOLTAREN) 1 % GEL Apply 4 g topically 4 (four) times daily. 01/14/22  Yes Sable Feil, PA-C  amphetamine-dextroamphetamine (ADDERALL XR) 15 MG 24 hr capsule Take 15 mg by mouth every morning.    [provider]    Allergies Meclizine and Sulfa antibiotics  Family History  Problem Relation Age of Onset   Hypertension Father     Social History Social History   Tobacco Use   Smoking status: Never   Smokeless tobacco: Never  Substance Use Topics   Alcohol use: Yes    Review of Systems Constitutional: No fever/chills Eyes: No visual changes. ENT: No sore throat. Cardiovascular: Denies chest pain. Respiratory: Denies shortness of breath. Gastrointestinal: No abdominal pain.  No nausea, no vomiting.  No diarrhea.  No constipation. Genitourinary: Negative for dysuria. Musculoskeletal: Negative for back pain.  Right lower leg pain Skin: Negative for rash. Neurological: Negative for  headaches, focal weakness or numbness. Psychiatric: ADD Allergic/Immunilogical: Sulfa antibiotics ____________________________________________   PHYSICAL EXAM:  VITAL SIGNS: BP is 130/83, pulse 101, respiration 12, temperature 97.6, and patient 97% O2 sat on room air.  Patient weighs 230 pounds and BMI is 31.19. Constitutional: Alert and oriented. Well appearing and in no acute distress. Eyes: Conjunctivae are normal. PERRL. EOMI. Head: Atraumatic. Nose: No congestion/rhinnorhea. Mouth/Throat: Mucous membranes are moist.  Oropharynx non-erythematous. Neck: No stridor.  No cervical spine tenderness to palpation. Hematological/Lymphatic/Immunilogical: No cervical lymphadenopathy. Cardiovascular: Tachycardic, regular rhythm. Grossly normal heart sounds.  Good peripheral circulation. Respiratory: Normal respiratory effort.  No retractions. Lungs CTAB. Gastrointestinal: Soft and nontender. No distention. No abdominal bruits. No CVA tenderness. Genitourinary: Deferred Musculoskeletal: No obvious deformity to the right lower leg.  Patient has mild to moderate guarding with palpation over the interosseous membrane.  No joint effusions. Neurologic:  Normal speech and language. No gross focal neurologic deficits are appreciated. No gait instability. Skin:  Skin is warm, dry and intact. No rash noted. Psychiatric: Mood and affect are normal. Speech and behavior are normal.  ____________________________________________   LABS         Component Ref Range & Units 9 d ago 1 yr ago 2 yr ago 3 yr ago  Color, UA  yellow yellow Light Yellow Lt yellow  Clarity, UA  clear clear Clear clear  Glucose, UA _0   Bilirubin, UA  neg negative Negative neg  Ketones, UA  neg negative Negative neg  Spec  Grav, UA 1.010 - 1.025 1.025 1.025 1.015 1.015  Blood, UA  neg negative Negative neg  pH, UA 5.0 - 8.0 6.0 6.0 6.0 6.0  Protein, UA Negative Negative Negative Negative  Negative  Urobilinogen, UA 0.2 or 1.0 E.U./dL 0.2 0.2 0.2 0.2  Nitrite, UA  neg negative Negative neg  Leukocytes, UA _0   Appearance   medium    Odor          0 Result Notes              Component Ref Range & Units 9 d ago (01/05/22) 1 yr ago (01/10/21) 2 yr ago (01/03/20) 2 yr ago (09/19/19) 2 yr ago (09/19/19) 3 yr ago (01/14/19) 3 yr ago (01/14/19)  Glucose 70 - 99 mg/dL 94 95 99 R 87 CM  84   Uric Acid 3.8 - 8.4 mg/dL 6.2 6.3 CM 6.4 CM      Comment:            Therapeutic target for gout patients: <6.0  BUN 6 - 20 mg/dL _1 Creatinine, Ser 0.76 - 1.27 mg/dL 0.98 1.02 1.00 1.28 High  R  0.96 R   eGFR >59 mL/min/1.73 104 100       BUN/Creatinine Ratio 9 - _2 Sodium 134 - 144 mmol/L 136 138 138 135 R  137 R   Potassium 3.5 - 5.2 mmol/L 4.4 4.1 4.2 3.4 Low  R  3.9 R   Chloride 96 - 106 mmol/L 101 101 98 100 R  104 R   Calcium 8.7 - 10.2 mg/dL 9.5 9.3 9.6 8.8 Low  R  9.4 R   Phosphorus 2.8 - 4.1 mg/dL 3.5 3.2 2.9      Total Protein 6.0 - 8.5 g/dL 7.3 7.5 7.6   7.9 R   Albumin 4.1 - 5.1 g/dL 4.8 4.9 R 4.8 R   4.7 R   Globulin, Total 1.5 - 4.5 g/dL 2.5 2.6 2.8      Albumin/Globulin Ratio 1.2 - 2.2 1.9 1.9 1.7      Bilirubin Total 0.0 - 1.2 mg/dL 0.5 0.5 0.5   0.9 R   Alkaline Phosphatase 44 - 121 IU/L 83 72 67 CM   58 R   LDH 121 - 224 IU/L 178 172 180      AST 0 - 40 IU/L _3 R   ALT 0 - 44 IU/L _4 R   GGT 0 - 65 IU/L _5 Iron 38 - 169 ug/dL 80 74 54      Cholesterol, Total 100 - 199 mg/dL 181 172 162      Triglycerides 0 - 149 mg/dL 93 71 61      HDL >39 mg/dL 55 52 47      VLDL Cholesterol Cal 5 - 40 mg/dL _6 LDL Chol Calc (NIH) 0 - 99 mg/dL 109 High  106 High  103 High       Chol/HDL Ratio 0.0 - 5.0 ratio 3.3 3.3 CM 3.4 CM      Comment:                                   T. Chol/HDL Ratio  Men  Women                                1/2 Avg.Risk  3.4    3.3                                   Avg.Risk  5.0    4.4                                2X Avg.Risk  9.6    7.1                                3X Avg.Risk 23.4   11.0  Estimated CHD Risk 0.0 - 1.0 times avg.  < 0.5  < 0.5 CM 0.5 CM      Comment: The CHD Risk is based on the T. Chol/HDL ratio. Other factors affect CHD Risk such as hypertension, smoking, diabetes, severe obesity, and family history of premature CHD.  TSH 0.450 - 4.500 uIU/mL 2.260 2.750 1.810      T4, Total 4.5 - 12.0 ug/dL 6.5 7.4 6.7      T3 Uptake Ratio 24 - 39 % _0 Free Thyroxine Index 1.2 - 4.9 1.8 1.9 2.0      WBC 3.4 - 10.8 x10E3/uL 8.3 7.0 7.6  16.8 High  R  8.2 R  RBC 4.14 - 5.80 x10E6/uL 4.74 4.87 4.62  4.21 Low  R  4.84 R  Hemoglobin 13.0 - 17.7 g/dL 14.6 15.1 14.6  13.1 R  14.8 R  Hematocrit 37.5 - 51.0 % 43.0 44.3 42.2  38.2 Low  R  42.2 R  MCV 79 - 97 fL 91 91 91  90.7 R  87.2 R  MCH 26.6 - 33.0 pg 30.8 31.0 31.6  31.1 R  30.6 R  MCHC 31.5 - 35.7 g/dL 34.0 34.1 34.6  34.3 R  35.1 R  RDW 11.6 - 15.4 % 11.9 12.5 13.0  12.6 R  12.7 R  Platelets 150 - 450 x10E3/uL 304 335 297  277 R  322 R  Neutrophils Not Estab. % 55 52 59    57 R  Lymphs Not Estab. % 32 36 29      Monocytes Not Estab. % _1 Eos Not Estab. % _2 Basos Not Estab. % _3 Neutrophils Absolute 1.4 - 7.0 x10E3/uL 4.5 3.7 4.5    4.7 R  Lymphocytes Absolute 0.7 - 3.1 x10E3/uL 2.7 2.5 2.2    2.4 R  Monocytes Absolute 0.1 - 0.9 x10E3/uL 0.8 0.6 0.7      EOS (ABSOLUTE) 0.0 - 0.4 x10E3/uL 0.3 0.2 0.1      Basophils Absolute 0.0 - 0.2 x10E3/uL 0.1 0.1 0.1    0.1 R  Immature Granulocytes Not Estab. % 0 0 0    0 R                ____________________________________________  EKG  Normal sinus rhythm at 65 bpm ____________________________________________    ____________________________________________   INITIAL IMPRESSION / ASSESSMENT AND PLAN / ED COURSE  As part  of my  medical decision making, I reviewed the following data within the [facility]      Discussed no acute findings on EKG and lab results.  Recommend Voltaren gel along with extra strength Tylenol for inflammation of the interosseous membrane right lower leg.  Follow-up 2 weeks if no improvement.        ____________________________________________   FINAL CLINICAL IMPRESSION   Well exam ED Discharge Orders          Ordered    diclofenac Sodium (VOLTAREN) 1 % GEL  4 times daily        01/14/22 8251             Note:  This document was prepared using Dragon voice recognition software and may include unintentional dictation errors.

## 2022-01-14 NOTE — Progress Notes (Signed)
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

## 2022-03-11 DIAGNOSIS — D485 Neoplasm of uncertain behavior of skin: Secondary | ICD-10-CM | POA: Diagnosis not present

## 2022-03-11 DIAGNOSIS — D225 Melanocytic nevi of trunk: Secondary | ICD-10-CM | POA: Diagnosis not present

## 2022-03-11 DIAGNOSIS — B354 Tinea corporis: Secondary | ICD-10-CM | POA: Diagnosis not present

## 2022-04-26 DIAGNOSIS — S93402A Sprain of unspecified ligament of left ankle, initial encounter: Secondary | ICD-10-CM | POA: Diagnosis not present

## 2022-06-22 ENCOUNTER — Ambulatory Visit: Payer: Self-pay | Admitting: Physician Assistant

## 2022-06-22 ENCOUNTER — Encounter: Payer: Self-pay | Admitting: Physician Assistant

## 2022-06-22 VITALS — BP 136/91 | HR 82 | Temp 97.8°F | Resp 16 | Ht 72.0 in | Wt 239.0 lb

## 2022-06-22 DIAGNOSIS — F411 Generalized anxiety disorder: Secondary | ICD-10-CM

## 2022-06-22 DIAGNOSIS — R002 Palpitations: Secondary | ICD-10-CM

## 2022-06-22 NOTE — Progress Notes (Signed)
   Subjective: Heart palpitations    Patient ID: Keith Wiggins, male    DOB: [birth year], 37 y.o.   MRN: [id number]  HPI Patient complains of 4 to 6 weeks of heart palpitations followed by diaphoresis.  Patient states no provocative incident for complaints.  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  Patient said this schedule ongoing greater than 1 year. Review of Systems Negative except for above complaint    Objective:   Physical Exam BP 136/91  Pulse 82  Resp 16  Temp 97.8 F (36.6 C)  SpO2 100 %  Weight 239 lb (108.4 kg)  Height 6' (1.829 m)   BMI 32.41 kg/m2  BSA 2.35 m2  Tobacco  Smoking status Never  Smokeless status Never  HEENT is unremarkable.  Neck is supple without lymphadenopathy or bruits.  Lungs are clear to auscultation.  Heart is regular rate and rhythm.  Review of EKGs in the past 3 years unremarkable.      Assessment & Plan: Anxiety versus cardiac etiology  Patient referred to EAP for further evaluation.  Patient consult to cardiology to rule out heart abnormalities.  Patient placed on a no work status pending evaluation by EAP.  Patient follow-up 1 week.

## 2022-06-22 NOTE — Progress Notes (Signed)
EKG completed and request to see provider with concern over recent few weeks of increased reported anxiety, stress and stated felt irregular heart beats.  Given [facility] phone number for [facility].  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

## 2022-06-23 NOTE — Addendum Note (Signed)
Addended by: Gardner Candle on: 06/23/2022 08:08 AM   Modules accepted: Orders

## 2022-06-25 ENCOUNTER — Ambulatory Visit: Payer: Self-pay | Admitting: Physician Assistant

## 2022-06-25 ENCOUNTER — Encounter: Payer: Self-pay | Admitting: Physician Assistant

## 2022-06-25 DIAGNOSIS — F411 Generalized anxiety disorder: Secondary | ICD-10-CM

## 2022-06-25 NOTE — Progress Notes (Signed)
Stated feeling like he is good to return to work on 06/29/22 as planned.  Telehealth with provider to follow up restricted note this week.

## 2022-06-25 NOTE — Progress Notes (Signed)
   Subjective: Anxiety    Patient ID: Keith Wiggins, male    DOB: [birth year], 37 y.o.   MRN: [id number]  HPI Patient is following up telephonically secondary to being diagnosed with anxiety secondary to increased stress at home and work.  Patient has started counseling with EAP and has a consult to cardiology secondary to his complaint of palpitations.  Patient is requesting a return to work status.   Review of Systems ADHD    Objective:   Physical Exam Physical exam was deferred secondary to this being a telephonic encounter.       Assessment & Plan: Anxiety   Patient advised to continue his EAP sessions and will consider antianxiety medication after cardiac evaluation.

## 2022-07-31 ENCOUNTER — Ambulatory Visit: Payer: 59 | Attending: Cardiology | Admitting: Cardiology

## 2022-07-31 ENCOUNTER — Ambulatory Visit (INDEPENDENT_AMBULATORY_CARE_PROVIDER_SITE_OTHER): Payer: 59

## 2022-07-31 ENCOUNTER — Encounter: Payer: Self-pay | Admitting: Cardiology

## 2022-07-31 VITALS — BP 110/84 | HR 60 | Ht 72.0 in | Wt 238.2 lb

## 2022-07-31 DIAGNOSIS — R002 Palpitations: Secondary | ICD-10-CM

## 2022-07-31 NOTE — Progress Notes (Signed)
Cardiology Office Note:    Date:  07/31/2022   ID:  Keith Wiggins, DOB [birth year], MRN [id number]  PCP:  Mick Sell, MD   [facility] Providers Cardiologist:  Debbe Odea, MD     Referring MD: Joni Reining, PA-C   Chief Complaint  Patient presents with   New Patient (Initial Visit)    Patient having frequent heart palpitations for the past year and getting worse for past few months.  Had 1 visit in 2021 with [facility].   Keith Wiggins is a 37 y.o. male who is being seen today for the evaluation of palpitations at the request of Wells Guiles.   History of Present Illness:    Keith Wiggins is a 37 y.o. male with a hx of anxiety, ADHD who presents due to palpitations.  States having symptoms of palpitations ongoing over the past year or so.  Symptoms worsening over the past 2 to 3 months.  He states exercising and eating healthier makes symptoms better.  Has not been exercising of late, also eating poorly which coincided with worsening of symptoms.  Denies any history of heart disease, denies dizziness or syncope.  Denies smoking.  Was previously on ADHD medications, currently off.  Denies drinking caffeinated products.  Past Medical History:  Diagnosis Date   ADD (attention deficit disorder)    Anxiety    Arm fracture, left    Dyspepsia    Genital warts    Overweight     History reviewed. No pertinent surgical history.  Current Medications: No outpatient medications have been marked as taking for the 07/31/22 encounter (Office Visit) with Debbe Odea, MD.     Allergies:   Meclizine and Sulfa antibiotics   Social History   Socioeconomic History   Marital status: Married    Spouse name: Not on file   Number of children: Not on file   Years of education: Not on file   Highest education level: Not on file  Occupational History   Not on file  Tobacco Use   Smoking status: Never   Smokeless tobacco: Never  Vaping Use    Vaping Use: Never used  Substance and Sexual Activity   Alcohol use: Yes    Comment: occasional   Drug use: Not Currently    Types: Marijuana   Sexual activity: Not on file  Other Topics Concern   Not on file  Social History Narrative   Not on file   Social Determinants of Health   Financial Resource Strain: Not on file  Food Insecurity: Not on file  Transportation Needs: Not on file  Physical Activity: Not on file  Stress: Not on file  Social Connections: Not on file     Family History: The patient's family history includes Heart disease in his paternal grandfather; Hypertension in his father.  ROS:   Please see the history of present illness.     All other systems reviewed and are negative.  EKGs/Labs/Other Studies Reviewed:    The following studies were reviewed today:     EKG obtained today, shows normal sinus rhythm, normal ECG.  Recent Labs: 01/05/2022: ALT 30; BUN 12; Creatinine, Ser 0.98; Hemoglobin 14.6; Platelets 304; Potassium 4.4; Sodium 136; TSH 2.260  Recent Lipid Panel    Component Value Date/Time   CHOL 181 01/05/2022 0851   TRIG 93 01/05/2022 0851   HDL 55 01/05/2022 0851   CHOLHDL 3.3 01/05/2022 0851   LDLCALC 109 (H) 01/05/2022  1610     Risk Assessment/Calculations:             Physical Exam:    VS:  BP 110/84 (BP Location: Right Arm, Patient Position: Sitting, Cuff Size: Large)   Pulse 60   Ht 6' (1.829 m)   Wt 238 lb 3.2 oz (108 kg)   SpO2 98%   BMI 32.31 kg/m     Wt Readings from Last 3 Encounters:  07/31/22 238 lb 3.2 oz (108 kg)  06/22/22 239 lb (108.4 kg)  01/14/22 230 lb (104.3 kg)     GEN:  Well nourished, well developed in no acute distress HEENT: Normal NECK: No JVD; No carotid bruits CARDIAC: RRR, no murmurs, rubs, gallops RESPIRATORY:  Clear to auscultation without rales, wheezing or rhonchi  ABDOMEN: Soft, non-tender, non-distended MUSCULOSKELETAL:  No edema; No deformity  SKIN: Warm and dry NEUROLOGIC:   Alert and oriented x 3 PSYCHIATRIC:  Normal affect   ASSESSMENT:    1. Palpitations    PLAN:    In order of problems listed above:  Palpitations, worsening with lack of exercise and eating poorly.  Overall patient is low risk for significant arrhythmias.  Place cardiac monitor to evaluate any significant abnormalities.  If cardiac monitor is benign, patient will be reassured.  Encouraged to exercise and eat heart healthy diet as this has helped with his symptoms previously.  Anxiety also in differential.  Follow-up after monitor.      Medication Adjustments/Labs and Tests Ordered: Current medicines are reviewed at length with the patient today.  Concerns regarding medicines are outlined above.  Orders Placed This Encounter  Procedures   LONG TERM MONITOR (3-14 DAYS)   EKG 12-Lead   No orders of the defined types were placed in this encounter.   Patient Instructions  Medication Instructions:   Your physician recommends that you continue on your current medications as directed. Please refer to the Current Medication list given to you today.  *If you need a refill on your cardiac medications before your next appointment, please call your pharmacy*   Lab Work:  None Ordered  If you have labs (blood work) drawn today and your tests are completely normal, you will receive your results only by: MyChart Message (if you have MyChart) OR A paper copy in the mail If you have any lab test that is abnormal or we need to change your treatment, we will call you to review the results.   Testing/Procedures:  Your physician has recommended that you wear a Zio monitor.   This monitor is a medical device that records the heart's electrical activity. Doctors most often use these monitors to diagnose arrhythmias. Arrhythmias are problems with the speed or rhythm of the heartbeat. The monitor is a small device applied to your chest. You can wear one while you do your normal daily  activities. While wearing this monitor if you have any symptoms to push the button and record what you felt. Once you have worn this monitor for the period of time provider prescribed (Usually 14 days), you will return the monitor device in the postage paid box. Once it is returned they will download the data collected and provide [place] with a report which the provider will then review and we will call you with those results. Important tips:  Avoid showering during the first 24 hours of wearing the monitor. Avoid excessive sweating to help maximize wear time. Do not submerge the device, no hot tubs, and no swimming pools.  Keep any lotions or oils away from the patch. After 24 hours you may shower with the patch on. Take brief showers with your back facing the shower head.  Do not remove patch once it has been placed because that will interrupt data and decrease adhesive wear time. Push the button when you have any symptoms and write down what you were feeling. Once you have completed wearing your monitor, remove and place into box which has postage paid and place in your outgoing mailbox.  If for some reason you have misplaced your box then call our office and we can provide another box and/or mail it off for you.      Follow-Up: At [facility], you and your health needs are our priority.  As part of our continuing mission to provide you with exceptional heart care, we have created designated Provider Care Teams.  These Care Teams include your primary Cardiologist (physician) and Advanced Practice Providers (APPs -  Physician Assistants and Nurse Practitioners) who all work together to provide you with the care you need, when you need it.  We recommend signing up for the patient portal called "MyChart".  Sign up information is provided on this After Visit Summary.  MyChart is used to connect with patients for Virtual Visits (Telemedicine).  Patients are able to view lab/test results,  encounter notes, upcoming appointments, etc.  Non-urgent messages can be sent to your provider as well.   To learn more about what you can do with MyChart, go to [email or web address].    Your next appointment:    After Testing   Provider:   You may see Debbe Odea, MD or one of the following Advanced Practice Providers on your designated Care Team:   Nicolasa Ducking, NP Eula Listen, PA-C Cadence Fransico [place], PA-C Charlsie Quest, NP   Signed, Debbe Odea, MD  07/31/2022 10:43 AM    [facility]

## 2022-07-31 NOTE — Patient Instructions (Signed)
Medication Instructions:   Your physician recommends that you continue on your current medications as directed. Please refer to the Current Medication list given to you today.  *If you need a refill on your cardiac medications before your next appointment, please call your pharmacy*   Lab Work:  None Ordered  If you have labs (blood work) drawn today and your tests are completely normal, you will receive your results only by: MyChart Message (if you have MyChart) OR A paper copy in the mail If you have any lab test that is abnormal or we need to change your treatment, we will call you to review the results.   Testing/Procedures:  Your physician has recommended that you wear a Zio monitor.   This monitor is a medical device that records the heart's electrical activity. Doctors most often use these monitors to diagnose arrhythmias. Arrhythmias are problems with the speed or rhythm of the heartbeat. The monitor is a small device applied to your chest. You can wear one while you do your normal daily activities. While wearing this monitor if you have any symptoms to push the button and record what you felt. Once you have worn this monitor for the period of time provider prescribed (Usually 14 days), you will return the monitor device in the postage paid box. Once it is returned they will download the data collected and provide [place] with a report which the provider will then review and we will call you with those results. Important tips:  Avoid showering during the first 24 hours of wearing the monitor. Avoid excessive sweating to help maximize wear time. Do not submerge the device, no hot tubs, and no swimming pools. Keep any lotions or oils away from the patch. After 24 hours you may shower with the patch on. Take brief showers with your back facing the shower head.  Do not remove patch once it has been placed because that will interrupt data and decrease adhesive wear time. Push the button  when you have any symptoms and write down what you were feeling. Once you have completed wearing your monitor, remove and place into box which has postage paid and place in your outgoing mailbox.  If for some reason you have misplaced your box then call our office and we can provide another box and/or mail it off for you.      Follow-Up: At [facility], you and your health needs are our priority.  As part of our continuing mission to provide you with exceptional heart care, we have created designated Provider Care Teams.  These Care Teams include your primary Cardiologist (physician) and Advanced Practice Providers (APPs -  Physician Assistants and Nurse Practitioners) who all work together to provide you with the care you need, when you need it.  We recommend signing up for the patient portal called "MyChart".  Sign up information is provided on this After Visit Summary.  MyChart is used to connect with patients for Virtual Visits (Telemedicine).  Patients are able to view lab/test results, encounter notes, upcoming appointments, etc.  Non-urgent messages can be sent to your provider as well.   To learn more about what you can do with MyChart, go to [email or web address].    Your next appointment:    After Testing   Provider:   You may see Debbe Odea, MD or one of the following Advanced Practice Providers on your designated Care Team:   Nicolasa Ducking, NP Eula Listen, PA-C Cadence Fransico Kemauri, PA-C Charlsie Quest, NP

## 2022-08-03 DIAGNOSIS — R002 Palpitations: Secondary | ICD-10-CM

## 2022-08-19 DIAGNOSIS — R002 Palpitations: Secondary | ICD-10-CM | POA: Diagnosis not present

## 2022-10-05 ENCOUNTER — Ambulatory Visit: Payer: 59 | Admitting: Cardiology

## 2022-11-24 ENCOUNTER — Encounter: Payer: Self-pay | Admitting: Cardiology

## 2022-11-24 ENCOUNTER — Ambulatory Visit: Payer: 59 | Attending: Cardiology | Admitting: Cardiology

## 2022-11-24 VITALS — BP 126/72 | HR 72 | Ht 72.0 in | Wt 240.2 lb

## 2022-11-24 DIAGNOSIS — R002 Palpitations: Secondary | ICD-10-CM

## 2022-11-24 NOTE — Progress Notes (Signed)
Cardiology Office Note:    Date:  11/24/2022   ID:  Keith Wiggins, DOB 12/22/1985, MRN [id number]  PCP:  Mick Sell, MD   [facility] Providers Cardiologist:  Debbe Odea, MD     Referring MD: Mick Sell, MD   Chief Complaint  Patient presents with   Follow-up    Following up post Zio. Patient is doing well on today. Meds reviewed.     History of Present Illness:    Keith Wiggins is a 37 y.o. male with a hx of anxiety, ADHD who presents due to palpitations.    Cardiac monitor was placed to evaluate cardiac etiology for palpitations.  Exercising and eating healthy has improved patient's symptoms.  He feels well, no concerns at this time, presents for cardiac monitor results.   Past Medical History:  Diagnosis Date   ADD (attention deficit disorder)    Anxiety    Arm fracture, left    Dyspepsia    Genital warts    Overweight     History reviewed. No pertinent surgical history.  Current Medications: No outpatient medications have been marked as taking for the 11/24/22 encounter (Office Visit) with Debbe Odea, MD.     Allergies:   Meclizine and Sulfa antibiotics   Social History   Socioeconomic History   Marital status: Married    Spouse name: Not on file   Number of children: Not on file   Years of education: Not on file   Highest education level: Not on file  Occupational History   Not on file  Tobacco Use   Smoking status: Never   Smokeless tobacco: Never  Vaping Use   Vaping status: Never Used  Substance and Sexual Activity   Alcohol use: Yes    Comment: occasional   Drug use: Not Currently    Types: Marijuana   Sexual activity: Not on file  Other Topics Concern   Not on file  Social History Narrative   Not on file   Social Determinants of Health   Financial Resource Strain: Not on file  Food Insecurity: Not on file  Transportation Needs: Not on file  Physical Activity: Not on file  Stress: Not on  file  Social Connections: Unknown (06/09/2021)   Received [facility]   Social Network    Social Network: Not on file     Family History: The patient's family history includes Heart disease in his paternal grandfather; Hypertension in his father.  ROS:   Please see the history of present illness.     All other systems reviewed and are negative.  EKGs/Labs/Other Studies Reviewed:    The following studies were reviewed today:     EKG obtained today, shows normal sinus rhythm, normal ECG.  Recent Labs: 01/05/2022: ALT 30; BUN 12; Creatinine, Ser 0.98; Hemoglobin 14.6; Platelets 304; Potassium 4.4; Sodium 136; TSH 2.260  Recent Lipid Panel    Component Value Date/Time   CHOL 181 01/05/2022 0851   TRIG 93 01/05/2022 0851   HDL 55 01/05/2022 0851   CHOLHDL 3.3 01/05/2022 0851   LDLCALC 109 (H) 01/05/2022 0851     Risk Assessment/Calculations:             Physical Exam:    VS:  BP 126/72 (BP Location: Left Arm, Patient Position: Sitting, Cuff Size: Normal)   Pulse 72   Ht 6' (1.829 m)   Wt 240 lb 3.2 oz (109 kg)   SpO2 97%  BMI 32.58 kg/m     Wt Readings from Last 3 Encounters:  11/24/22 240 lb 3.2 oz (109 kg)  07/31/22 238 lb 3.2 oz (108 kg)  06/22/22 239 lb (108.4 kg)     GEN:  Well nourished, well developed in no acute distress HEENT: Normal NECK: No JVD; No carotid bruits CARDIAC: RRR, no murmurs, rubs, gallops RESPIRATORY:  Clear to auscultation without rales, wheezing or rhonchi  ABDOMEN: Soft, non-tender, non-distended MUSCULOSKELETAL:  No edema; No deformity  SKIN: Warm and dry NEUROLOGIC:  Alert and oriented x 3 PSYCHIATRIC:  Normal affect   ASSESSMENT:    1. Palpitations    PLAN:    In order of problems listed above:  Palpitations, cardiac monitor showed no significant arrhythmias, patient triggered events associated with sinus rhythm and rare PACs.  Overall benign cardiac monitor.  Patient made aware of results,  reassured.    Encouraged to continue exercising and eat a heart healthy diet.  Follow-up as needed.     Medication Adjustments/Labs and Tests Ordered: Current medicines are reviewed at length with the patient today.  Concerns regarding medicines are outlined above.  No orders of the defined types were placed in this encounter.  No orders of the defined types were placed in this encounter.   Patient Instructions  Medication Instructions:   Your physician recommends that you continue on your current medications as directed. Please refer to the Current Medication list given to you today.  *If you need a refill on your cardiac medications before your next appointment, please call your pharmacy*   Lab Work:  None Ordered  If you have labs (blood work) drawn today and your tests are completely normal, you will receive your results only by: MyChart Message (if you have MyChart) OR A paper copy in the mail If you have any lab test that is abnormal or we need to change your treatment, we will call you to review the results.   Testing/Procedures:  None Ordered    Follow-Up: At [facility], you and your health needs are our priority.  As part of our continuing mission to provide you with exceptional heart care, we have created designated Provider Care Teams.  These Care Teams include your primary Cardiologist (physician) and Advanced Practice Providers (APPs -  Physician Assistants and Nurse Practitioners) who all work together to provide you with the care you need, when you need it.  We recommend signing up for the patient portal called "MyChart".  Sign up information is provided on this After Visit Summary.  MyChart is used to connect with patients for Virtual Visits (Telemedicine).  Patients are able to view lab/test results, encounter notes, upcoming appointments, etc.  Non-urgent messages can be sent to your provider as well.   To learn more about what you can do with  MyChart, go to [email or web address].    Your next appointment:    As needed   Signed, Debbe Odea, MD  11/24/2022 9:08 AM    [facility]

## 2022-11-24 NOTE — Patient Instructions (Signed)
Medication Instructions:   Your physician recommends that you continue on your current medications as directed. Please refer to the Current Medication list given to you today.  *If you need a refill on your cardiac medications before your next appointment, please call your pharmacy*   Lab Work:  None Ordered  If you have labs (blood work) drawn today and your tests are completely normal, you will receive your results only by: MyChart Message (if you have MyChart) OR A paper copy in the mail If you have any lab test that is abnormal or we need to change your treatment, we will call you to review the results.   Testing/Procedures:  None Ordered   Follow-Up: At [facility], you and your health needs are our priority.  As part of our continuing mission to provide you with exceptional heart care, we have created designated Provider Care Teams.  These Care Teams include your primary Cardiologist (physician) and Advanced Practice Providers (APPs -  Physician Assistants and Nurse Practitioners) who all work together to provide you with the care you need, when you need it.  We recommend signing up for the patient portal called "MyChart".  Sign up information is provided on this After Visit Summary.  MyChart is used to connect with patients for Virtual Visits (Telemedicine).  Patients are able to view lab/test results, encounter notes, upcoming appointments, etc.  Non-urgent messages can be sent to your provider as well.   To learn more about what you can do with MyChart, go to [email or web address].    Your next appointment:    As needed

## 2022-12-03 DIAGNOSIS — Z6831 Body mass index (BMI) 31.0-31.9, adult: Secondary | ICD-10-CM | POA: Diagnosis not present

## 2022-12-03 DIAGNOSIS — J029 Acute pharyngitis, unspecified: Secondary | ICD-10-CM | POA: Diagnosis not present

## 2023-01-03 DIAGNOSIS — Z20822 Contact with and (suspected) exposure to covid-19: Secondary | ICD-10-CM | POA: Diagnosis not present

## 2023-01-03 DIAGNOSIS — R5383 Other fatigue: Secondary | ICD-10-CM | POA: Diagnosis not present

## 2023-01-11 ENCOUNTER — Ambulatory Visit: Payer: Self-pay

## 2023-01-11 ENCOUNTER — Encounter: Payer: 59 | Admitting: Physician Assistant

## 2023-01-11 DIAGNOSIS — Z0289 Encounter for other administrative examinations: Secondary | ICD-10-CM

## 2023-01-11 LAB — POCT URINALYSIS DIPSTICK
Bilirubin, UA: NEGATIVE
Blood, UA: NEGATIVE
Glucose, UA: NEGATIVE
Ketones, UA: NEGATIVE
Leukocytes, UA: NEGATIVE
Nitrite, UA: NEGATIVE
Protein, UA: NEGATIVE
Spec Grav, UA: 1.01 (ref 1.010–1.025)
Urobilinogen, UA: 0.2 U/dL
pH, UA: 6 (ref 5.0–8.0)

## 2023-01-11 NOTE — Progress Notes (Signed)
PT COMPLETED LABS FOR FF PHYSICAL. Gretel Acre

## 2023-01-12 LAB — CMP12+LP+TP+TSH+6AC+CBC/D/PLT
ALT: 26 [IU]/L (ref 0–44)
AST: 21 [IU]/L (ref 0–40)
Albumin: 4.8 g/dL (ref 4.1–5.1)
Alkaline Phosphatase: 65 [IU]/L (ref 44–121)
BUN/Creatinine Ratio: 14 (ref 9–20)
BUN: 12 mg/dL (ref 6–20)
Basophils Absolute: 0.1 10*3/uL (ref 0.0–0.2)
Basos: 1 %
Bilirubin Total: 0.6 mg/dL (ref 0.0–1.2)
Calcium: 9.4 mg/dL (ref 8.7–10.2)
Chloride: 100 mmol/L (ref 96–106)
Chol/HDL Ratio: 3.6 {ratio} (ref 0.0–5.0)
Cholesterol, Total: 174 mg/dL (ref 100–199)
Creatinine, Ser: 0.83 mg/dL (ref 0.76–1.27)
EOS (ABSOLUTE): 0.1 10*3/uL (ref 0.0–0.4)
Eos: 2 %
Estimated CHD Risk: 0.6 times avg. (ref 0.0–1.0)
Free Thyroxine Index: 2.2 (ref 1.2–4.9)
GGT: 15 [IU]/L (ref 0–65)
Globulin, Total: 2.8 g/dL (ref 1.5–4.5)
Glucose: 94 mg/dL (ref 70–99)
HDL: 48 mg/dL (ref >39)
Hematocrit: 42.8 % (ref 37.5–51.0)
Hemoglobin: 14.4 g/dL (ref 13.0–17.7)
Immature Grans (Abs): 0 10*3/uL (ref 0.0–0.1)
Immature Granulocytes: 0 %
Iron: 103 ug/dL (ref 38–169)
LDH: 174 [IU]/L (ref 121–224)
LDL Chol Calc (NIH): 111 mg/dL — ABNORMAL HIGH (ref 0–99)
Lymphocytes Absolute: 2.2 10*3/uL (ref 0.7–3.1)
Lymphs: 34 %
MCH: 30.9 pg (ref 26.6–33.0)
MCHC: 33.6 g/dL (ref 31.5–35.7)
MCV: 92 fL (ref 79–97)
Monocytes Absolute: 0.6 10*3/uL (ref 0.1–0.9)
Monocytes: 10 %
Neutrophils Absolute: 3.4 10*3/uL (ref 1.4–7.0)
Neutrophils: 53 %
Phosphorus: 3.4 mg/dL (ref 2.8–4.1)
Platelets: 323 10*3/uL (ref 150–450)
Potassium: 4.2 mmol/L (ref 3.5–5.2)
RBC: 4.66 x10E6/uL (ref 4.14–5.80)
RDW: 13 % (ref 11.6–15.4)
Sodium: 135 mmol/L (ref 134–144)
T3 Uptake Ratio: 28 % (ref 24–39)
T4, Total: 7.8 ug/dL (ref 4.5–12.0)
TSH: 3.21 u[IU]/mL (ref 0.450–4.500)
Total Protein: 7.6 g/dL (ref 6.0–8.5)
Triglycerides: 83 mg/dL (ref 0–149)
Uric Acid: 5.7 mg/dL (ref 3.8–8.4)
VLDL Cholesterol Cal: 15 mg/dL (ref 5–40)
WBC: 6.4 10*3/uL (ref 3.4–10.8)
eGFR: 117 mL/min/{1.73_m2} (ref >59)

## 2023-01-18 ENCOUNTER — Ambulatory Visit: Payer: Self-pay | Admitting: Physician Assistant

## 2023-01-18 ENCOUNTER — Encounter: Payer: Self-pay | Admitting: Physician Assistant

## 2023-01-18 VITALS — BP 137/86 | HR 80 | Temp 97.8°F | Resp 14 | Ht 72.0 in | Wt 235.0 lb

## 2023-01-18 DIAGNOSIS — Z0289 Encounter for other administrative examinations: Secondary | ICD-10-CM

## 2023-01-18 DIAGNOSIS — Z3009 Encounter for other general counseling and advice on contraception: Secondary | ICD-10-CM

## 2023-01-18 DIAGNOSIS — R5383 Other fatigue: Secondary | ICD-10-CM

## 2023-01-18 NOTE — Progress Notes (Signed)
Pt presents today to complete FF physical, Pt concerned with waking up feeling foggy headed, off balance, fatigue with 8 hrs of sleep and the feeling of anxiety. Pt states he's been dealing with this for some time now, but getting worse.
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
## 2023-01-18 NOTE — Addendum Note (Signed)
Addended by: Gardner Candle on: 01/18/2023 10:26 AM   Modules accepted: Orders

## 2023-01-20 LAB — TESTOSTERONE,FREE AND TOTAL
Testosterone, Free: 7.9 pg/mL — ABNORMAL LOW (ref 8.7–25.1)
Testosterone: 431 ng/dL (ref 264–916)

## 2023-01-20 LAB — CORTISOL-AM, BLOOD: Cortisol - AM: 8.1 ug/dL (ref 6.2–19.4)

## 2023-02-22 ENCOUNTER — Encounter: Payer: Self-pay | Admitting: Physician Assistant

## 2023-02-22 ENCOUNTER — Ambulatory Visit: Payer: Self-pay | Admitting: Physician Assistant

## 2023-02-22 DIAGNOSIS — Z760 Encounter for issue of repeat prescription: Secondary | ICD-10-CM

## 2023-02-22 MED ORDER — AMPHETAMINE-DEXTROAMPHETAMINE 15 MG PO TABS: 15.0000 mg | ORAL_TABLET | Freq: Every day | ORAL | 0 refills | Status: DC

## 2023-02-22 NOTE — Progress Notes (Signed)
 Pt requesting rx refill on adderalll

## 2023-02-22 NOTE — Progress Notes (Signed)
   Subjective: Medication refill    Patient ID: Keith Wiggins, male    DOB: 05-02-1985, 38 y.o.   MRN: [id number]  HPI Patient requests refill of Adderall for ADHD (inattention).   Review of Systems Negative septa above complaint    Objective:   Physical Exam Physical exam deferred       Assessment & Plan: Medication refill for ADHD   30-day supply Adderall sent to pharmacy.

## 2023-02-23 ENCOUNTER — Other Ambulatory Visit: Payer: Self-pay | Admitting: Physician Assistant

## 2023-02-23 MED ORDER — AMPHETAMINE-DEXTROAMPHET ER 15 MG PO CP24: 15.0000 mg | ORAL_CAPSULE | ORAL | 0 refills | Status: DC

## 2023-02-25 ENCOUNTER — Ambulatory Visit: Payer: Self-pay | Admitting: Urology

## 2023-03-12 ENCOUNTER — Ambulatory Visit: Payer: Self-pay | Admitting: Urology

## 2023-04-06 ENCOUNTER — Other Ambulatory Visit: Payer: Self-pay | Admitting: Physician Assistant

## 2023-04-23 ENCOUNTER — Ambulatory Visit: Payer: Self-pay | Admitting: Urology

## 2023-05-17 ENCOUNTER — Other Ambulatory Visit: Payer: Self-pay | Admitting: Physician Assistant

## 2023-05-17 DIAGNOSIS — F900 Attention-deficit hyperactivity disorder, predominantly inattentive type: Secondary | ICD-10-CM

## 2023-06-21 ENCOUNTER — Other Ambulatory Visit: Payer: Self-pay

## 2023-06-21 DIAGNOSIS — F900 Attention-deficit hyperactivity disorder, predominantly inattentive type: Secondary | ICD-10-CM

## 2023-06-21 MED ORDER — AMPHETAMINE-DEXTROAMPHET ER 15 MG PO CP24: 15.0000 mg | ORAL_CAPSULE | ORAL | 0 refills | Status: AC

## 2023-06-30 ENCOUNTER — Other Ambulatory Visit: Payer: Self-pay | Admitting: Physician Assistant

## 2023-06-30 ENCOUNTER — Encounter: Payer: Self-pay | Admitting: Physician Assistant

## 2023-06-30 ENCOUNTER — Ambulatory Visit: Payer: Self-pay | Admitting: Physician Assistant

## 2023-06-30 VITALS — Resp 14

## 2023-06-30 DIAGNOSIS — R55 Syncope and collapse: Secondary | ICD-10-CM

## 2023-06-30 LAB — POCT URINALYSIS DIPSTICK
Bilirubin, UA: NEGATIVE
Blood, UA: NEGATIVE
Glucose, UA: NEGATIVE
Ketones, UA: NEGATIVE
Leukocytes, UA: NEGATIVE
Nitrite, UA: NEGATIVE
Protein, UA: NEGATIVE
Spec Grav, UA: <=1.005 — AB (ref 1.010–1.025)
Urobilinogen, UA: 0.2 U/dL
pH, UA: 6.5 (ref 5.0–8.0)

## 2023-06-30 MED ORDER — MECLIZINE HCL 25 MG PO TABS: 25.0000 mg | ORAL_TABLET | Freq: Three times a day (TID) | ORAL | 0 refills | Status: AC | PRN

## 2023-06-30 NOTE — Progress Notes (Signed)
 Pt presents today with dizzy spells. Pt states for the past couple of months he's been dealing with extreme anxiety feeling all over his body w/brain fog and confusion. At work makes it worse but not as bad while at home. The last couple of weeks when feeling like this, he states its been causing some breathing restrictions (like breathing through a straw). As of yesterday pt started having dizzy spells when bending over and standing back up saying it was causing black outs all day.

## 2023-06-30 NOTE — Progress Notes (Signed)
   Subjective:Vertigo    Patient ID: Keith Wiggins, male    DOB: 05-11-1985, 38 y.o.   MRN: [id number]  HPI Patient c/o approximately 1 month transient vertigo.  Stated condition is noticeable with position changes i.e. bending over to tie his shoes or appropriate minutes.  State condition waxes 3 to 5 seconds.  No residual complaint after episode.  Patient also complaining of anxiety.  States in the past few months he has noticed increased anxiety at work and home.  States complaint does not interfere with work.  Denies any other provocative incident for complaint.  The only palliative attempts is from waiting to episode expires.  Entertained patient history he states that he might be allergic to Antivert.  Patient at this is based on what his mother told him years ago.  Patient took Antivert in 2021 without any side effects.   Review of Systems ADHD.    Objective:   Physical Exam Orthostatic BP 113/73 127/87 118/72 126/74  Patient Position Sitting Standing Supine Sitting  Cuff Size Large Large Large Large  Orthostatic Pulse 86 82 82 78  Resp 14 -- -- --  SpO2 99 % -- -- --  HEENT was unremarkable.  Patient had negative Romberg test. Heart regular rate and rhythm.      Assessment & Plan:Vertigo  Trial of antivert and consult to EAP.

## 2023-07-07 ENCOUNTER — Ambulatory Visit: Payer: Self-pay | Admitting: Physician Assistant

## 2023-10-07 DIAGNOSIS — L235 Allergic contact dermatitis due to other chemical products: Secondary | ICD-10-CM | POA: Diagnosis not present

## 2023-12-23 ENCOUNTER — Encounter

## 2023-12-28 ENCOUNTER — Ambulatory Visit: Payer: Self-pay

## 2023-12-28 ENCOUNTER — Encounter: Admitting: Physician Assistant

## 2023-12-28 DIAGNOSIS — Z0289 Encounter for other administrative examinations: Secondary | ICD-10-CM

## 2023-12-28 LAB — POCT URINALYSIS DIPSTICK
Bilirubin, UA: NEGATIVE
Blood, UA: NEGATIVE
Glucose, UA: NEGATIVE
Ketones, UA: NEGATIVE
Leukocytes, UA: NEGATIVE
Nitrite, UA: NEGATIVE
Protein, UA: NEGATIVE
Spec Grav, UA: 1.015 (ref 1.010–1.025)
Urobilinogen, UA: 0.2 U/dL
pH, UA: 6 (ref 5.0–8.0)

## 2023-12-28 NOTE — Progress Notes (Signed)
 Pt completed labs for physical. Keith Wiggins

## 2023-12-30 LAB — CMP12+LP+TP+TSH+6AC+CBC/D/PLT
ALT: 23 IU/L (ref 0–44)
AST: 20 IU/L (ref 0–40)
Albumin: 4.7 g/dL (ref 4.1–5.1)
Alkaline Phosphatase: 77 IU/L (ref 47–123)
BUN/Creatinine Ratio: 15 (ref 9–20)
BUN: 14 mg/dL (ref 6–20)
Basophils Absolute: 0.1 x10E3/uL (ref 0.0–0.2)
Basos: 1 %
Bilirubin Total: 0.8 mg/dL (ref 0.0–1.2)
Calcium: 10 mg/dL (ref 8.7–10.2)
Chloride: 99 mmol/L (ref 96–106)
Chol/HDL Ratio: 2.8 ratio (ref 0.0–5.0)
Cholesterol, Total: 176 mg/dL (ref 100–199)
Creatinine, Ser: 0.96 mg/dL (ref 0.76–1.27)
EOS (ABSOLUTE): 0.1 x10E3/uL (ref 0.0–0.4)
Eos: 2 %
Estimated CHD Risk: <0.5 times avg. (ref 0.0–1.0)
Free Thyroxine Index: 2 (ref 1.2–4.9)
GGT: 15 IU/L (ref 0–65)
Globulin, Total: 2.7 g/dL (ref 1.5–4.5)
Glucose: 87 mg/dL (ref 70–99)
HDL: 64 mg/dL (ref >39)
Hematocrit: 47.1 % (ref 37.5–51.0)
Hemoglobin: 15.3 g/dL (ref 13.0–17.7)
Immature Grans (Abs): 0 x10E3/uL (ref 0.0–0.1)
Immature Granulocytes: 0 %
Iron: 121 ug/dL (ref 38–169)
LDH: 176 IU/L (ref 121–224)
LDL Chol Calc (NIH): 100 mg/dL — ABNORMAL HIGH (ref 0–99)
Lymphocytes Absolute: 2.3 x10E3/uL (ref 0.7–3.1)
Lymphs: 32 %
MCH: 31.2 pg (ref 26.6–33.0)
MCHC: 32.5 g/dL (ref 31.5–35.7)
MCV: 96 fL (ref 79–97)
Monocytes Absolute: 0.8 x10E3/uL (ref 0.1–0.9)
Monocytes: 12 %
Neutrophils Absolute: 3.9 x10E3/uL (ref 1.4–7.0)
Neutrophils: 53 %
Phosphorus: 3.2 mg/dL (ref 2.8–4.1)
Platelets: 329 x10E3/uL (ref 150–450)
Potassium: 4.6 mmol/L (ref 3.5–5.2)
RBC: 4.9 x10E6/uL (ref 4.14–5.80)
RDW: 12.2 % (ref 11.6–15.4)
Sodium: 136 mmol/L (ref 134–144)
T3 Uptake Ratio: 31 % (ref 24–39)
T4, Total: 6.4 ug/dL (ref 4.5–12.0)
TSH: 2.97 u[IU]/mL (ref 0.450–4.500)
Total Protein: 7.4 g/dL (ref 6.0–8.5)
Triglycerides: 63 mg/dL (ref 0–149)
Uric Acid: 6.3 mg/dL (ref 3.8–8.4)
VLDL Cholesterol Cal: 12 mg/dL (ref 5–40)
WBC: 7.1 x10E3/uL (ref 3.4–10.8)
eGFR: 105 mL/min/1.73 (ref >59)

## 2024-01-03 ENCOUNTER — Ambulatory Visit: Payer: Self-pay | Admitting: Physician Assistant

## 2024-01-03 ENCOUNTER — Encounter: Payer: Self-pay | Admitting: Physician Assistant

## 2024-01-03 VITALS — BP 110/64 | HR 80 | Temp 97.7°F | Resp 16 | Ht 72.0 in | Wt 215.0 lb

## 2024-01-03 DIAGNOSIS — Z0289 Encounter for other administrative examinations: Secondary | ICD-10-CM

## 2024-01-03 DIAGNOSIS — R1012 Left upper quadrant pain: Secondary | ICD-10-CM

## 2024-01-03 NOTE — Addendum Note (Signed)
 Addended by: CINDIE SMILES F on: 01/03/2024 09:19 AM   Modules accepted: Orders

## 2024-01-03 NOTE — Progress Notes (Signed)
 Pt presents today to complete FF physical. Pt states he's been having some pain in upper LFTquad  for about 5-6 months. Tender to touch. Pt believes a day after drinking it makes the pain worse. Its painful to turn over in bed as well. (Side to side)

## 2024-01-03 NOTE — Progress Notes (Signed)
 [facility]  ____________________________________________   None    (approximate)  I have reviewed the triage vital signs and the nursing notes.   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  Patient complaining 6 months of left upper quadrant pain which increased with consumption of alcohol.  Denies nausea or vomiting.  No diarrhea.  Described pain as dull/aching.  No palliative measures for complaint.         Past Medical History:  Diagnosis Date   ADD (attention deficit disorder)    Anxiety    Arm fracture, left    Dyspepsia    Genital warts    Overweight     Patient Active Problem List   Diagnosis Date Noted   Attention deficit hyperactivity disorder (ADHD), predominantly inattentive type 10/25/2018    No past surgical history on file.  Prior to Admission medications   Medication Sig Start Date End Date Taking? Authorizing Provider  amoxicillin-clavulanate (AUGMENTIN) 875-125 MG tablet Take 1 tablet by mouth 2 (two) times daily. 01/03/23   [provider]  amphetamine -dextroamphetamine  (ADDERALL XR) 15 MG 24 hr capsule Take 1 capsule by mouth every morning. 06/21/23   Claudene Tanda POUR, PA-C  meclizine  (ANTIVERT ) 25 MG tablet Take 1 tablet (25 mg total) by mouth 3 (three) times daily as needed for dizziness. 06/30/23   Claudene Tanda POUR, PA-C    Allergies Meclizine  and Sulfa antibiotics  Family History  Problem Relation Age of Onset   Hypertension Father    Heart disease Paternal Grandfather     Social History Social History   Tobacco Use   Smoking status: Never   Smokeless tobacco: Never  Vaping Use   Vaping status: Never Used  Substance Use Topics   Alcohol use: Yes    Comment: occasional   Drug use: Not Currently    Types: Marijuana    Review of  Systems Constitutional: No fever/chills Eyes: No visual changes. ENT: No sore throat. Cardiovascular: Denies chest pain. Respiratory: Denies shortness of breath. Gastrointestinal: No abdominal pain.  No nausea, no vomiting.  No diarrhea.  No constipation.  Left upper quadrant pain. Genitourinary: Negative for dysuria. Musculoskeletal: Negative for back pain. Skin: Negative for rash. Neurological: Negative for headaches, focal weakness or numbness. Psychiatric: ADD and anxiety Endocrine:  Hematological/Lymphatic:  Allergic/Immunilogical: sulfa antibiotics ____________________________________________   PHYSICAL EXAM:  VITAL SIGNS: BP 110/64  Cuff Size Large  Pulse Rate 80  Temp 97.7 F (36.5 C)  Temp Source Temporal  Weight 215 lb (97.5 kg)  Height 6' (1.829 m)  Resp 16  SpO2 99 %   Other Vitals   BMI: 29.16 kg/m2  BSA: 2.23 m2   Constitutional: Alert and oriented. Well appearing and in no acute distress. Eyes: Conjunctivae are normal. PERRL. EOMI. Head: Atraumatic. Nose: No congestion/rhinnorhea. Mouth/Throat: Mucous membranes are moist.  Oropharynx non-erythematous. Neck: No stridor.No cervical spine tenderness to palpation. Hematological/Lymphatic/Immunilogical: No cervical lymphadenopathy. Cardiovascular: Normal rate, regular rhythm. Grossly normal heart sounds.  Good peripheral circulation. Respiratory: Normal respiratory effort.  No retractions. Lungs CTAB. Gastrointestinal: Soft and nontender. No distention. No abdominal bruits. No CVA tenderness. Genitourinary: Deferred Musculoskeletal: No lower extremity tenderness nor edema.  No joint effusions. Neurologic:  Normal speech and language. No gross focal neurologic deficits are appreciated. No gait instability. Skin:  Skin is warm, dry and intact. No rash  noted. Psychiatric: Mood and affect are normal. Speech and behavior are normal.  ____________________________________________   LABS            Component Ref Range & Units (hover) 6 d ago 6 mo ago 11 mo ago 1 yr ago 2 yr ago 4 yr ago 5 yr ago  Color, UA yellow yellow Light Yellow yellow yellow Light Yellow Lt yellow  Clarity, UA clear clear Clear clear clear Clear clear  Glucose, UA Negative Negative Negative Negative Negative Negative Negative  Bilirubin, UA neg neg Negative neg negative Negative neg  Ketones, UA neg neg Negative neg negative Negative neg  Spec Grav, UA 1.015 <=1.005 Abnormal  1.010 1.025 1.025 1.015 1.015  Blood, UA neg neg Negative neg negative Negative neg  pH, UA 6.0 6.5 6.0 6.0 6.0 6.0 6.0  Protein, UA Negative Negative Negative Negative Negative Negative Negative  Urobilinogen, UA 0.2 0.2 0.2 0.2 0.2 0.2 0.2  Nitrite, UA neg neg Negative neg negative Negative neg  Leukocytes, UA Negative Negative Negative Negative Negative Negative Negative  Appearance     medium    Odor                     Component Ref Range & Units (hover) 6 d ago (12/28/23) 11 mo ago (01/11/23) 1 yr ago (01/05/22) 2 yr ago (01/10/21) 4 yr ago (01/03/20) 4 yr ago (09/19/19) 4 yr ago (09/19/19)  Glucose 87 94 94 95 99 R 87 CM   Uric Acid 6.3 5.7 CM 6.2 CM 6.3 CM 6.4 CM    Comment:            Therapeutic target for gout patients: <6.0  BUN 14 12 12 12 13 13    Creatinine, Ser 0.96 0.83 0.98 1.02 1.00 1.28 High  R   eGFR 105 117 104 100     BUN/Creatinine Ratio 15 14 12 12 13     Sodium 136 135 136 138 138 135 R   Potassium 4.6 4.2 4.4 4.1 4.2 3.4 Low  R   Chloride 99 100 101 101 98 100 R   Calcium 10.0 9.4 9.5 9.3 9.6 8.8 Low  R   Phosphorus 3.2 3.4 3.5 3.2 2.9    Total Protein 7.4 7.6 7.3 7.5 7.6    Albumin 4.7 4.8 4.8 4.9 R 4.8 R    Globulin, Total 2.7 2.8 2.5 2.6 2.8    Bilirubin Total 0.8 0.6 0.5 0.5 0.5    Alkaline Phosphatase 77 65 R 83 R 72 R 67 R, CM    LDH 176 174 178 172 180    AST 20 21 22 19 16     ALT 23 26 30 29 22     GGT 15 15 17 18 18     Iron 121 103 80 74 54    Cholesterol, Total 176 174 181 172 162     Triglycerides 63 83 93 71 61    HDL 64 48 55 52 47    VLDL Cholesterol Cal 12 15 17 14 12     LDL Chol Calc (NIH) 100 High  111 High  109 High  106 High  103 High     Chol/HDL Ratio 2.8 3.6 CM 3.3 CM 3.3 CM 3.4 CM    Comment:                                   T. Chol/HDL Ratio  Men  Women                               1/2 Avg.Risk  3.4    3.3                                   Avg.Risk  5.0    4.4                                2X Avg.Risk  9.6    7.1                                3X Avg.Risk 23.4   11.0  Estimated CHD Risk  < 0.5 0.6 CM  < 0.5 CM  < 0.5 CM 0.5 CM    Comment: The CHD Risk is based on the T. Chol/HDL ratio. Other factors affect CHD Risk such as hypertension, smoking, diabetes, severe obesity, and family history of premature CHD.  TSH 2.970 3.210 2.260 2.750 1.810    T4, Total 6.4 7.8 6.5 7.4 6.7    T3 Uptake Ratio 31 28 27 25 30     Free Thyroxine Index 2.0 2.2 1.8 1.9 2.0    WBC 7.1 6.4 8.3 7.0 7.6  16.8 High  R  RBC 4.90 4.66 4.74 4.87 4.62  4.21 Low  R  Hemoglobin 15.3 14.4 14.6 15.1 14.6  13.1 R  Hematocrit 47.1 42.8 43.0 44.3 42.2  38.2 Low  R  MCV 96 92 91 91 91  90.7 R  MCH 31.2 30.9 30.8 31.0 31.6  31.1 R  MCHC 32.5 33.6 34.0 34.1 34.6  34.3 R  RDW 12.2 13.0 11.9 12.5 13.0  12.6 R  Platelets 329 323 304 335 297  277 R  Neutrophils 53 53 55 52 59    Lymphs 32 34 32 36 29    Monocytes 12 10 9 8 9     Eos 2 2 3 3 2     Basos 1 1 1 1 1     Neutrophils Absolute 3.9 3.4 4.5 3.7 4.5    Lymphocytes Absolute 2.3 2.2 2.7 2.5 2.2    Monocytes Absolute 0.8 0.6 0.8 0.6 0.7    EOS (ABSOLUTE) 0.1 0.1 0.3 0.2 0.1    Basophils Absolute 0.1 0.1 0.1 0.1 0.1    Immature Granulocytes 0 0 0 0 0    Immature Grans (Abs) 0.0 0.0 0.0 0.0 0.0                     ____________________________________________  EKG  Sinus rhythm at 73  bpm ____________________________________________    ____________________________________________   INITIAL IMPRESSION / ASSESSMENT AND PLAN  As part of my medical decision making, I reviewed the following data within the electronic MEDICAL RECORD NUMBER         No acute findings on physical exam, EKG, or labs.  Will order amylase, lipase, C-reactive protein to further evaluate left upper quadrant pain.     ____________________________________________   FINAL CLINICAL IMPRESSION @[facility] @   ED Discharge Orders          Ordered    Lipase        01/03/24 0835    Amylase  01/03/24 0835    C-reactive protein-CRP (Quant.)        01/03/24 9164             Note:  This document was prepared using Dragon voice recognition software and may include unintentional dictation errors.

## 2024-01-05 LAB — AMYLASE: Amylase: 84 U/L (ref 31–110)

## 2024-01-05 LAB — LIPASE: Lipase: 27 U/L (ref 13–78)

## 2024-01-05 LAB — C-REACTIVE PROTEIN: CRP: <1 mg/L (ref 0–10)

## 2024-01-25 ENCOUNTER — Ambulatory Visit: Payer: Self-pay | Admitting: Physician Assistant

## 2024-01-25 ENCOUNTER — Encounter: Payer: Self-pay | Admitting: Physician Assistant

## 2024-01-25 DIAGNOSIS — R0789 Other chest pain: Secondary | ICD-10-CM

## 2024-01-25 MED ORDER — DICLOFENAC SODIUM 75 MG PO TBEC: 75.0000 mg | DELAYED_RELEASE_TABLET | Freq: Two times a day (BID) | ORAL | 0 refills | Status: AC

## 2024-01-25 MED ORDER — METHOCARBAMOL 500 MG PO TABS: 500.0000 mg | ORAL_TABLET | Freq: Three times a day (TID) | ORAL | 0 refills | Status: AC

## 2024-01-25 NOTE — Progress Notes (Signed)
° °  Subjective: Left chest wall pain    Patient ID: Keith Wiggins, male    DOB: 1985-08-24, 38 y.o.   MRN: [id number]  HPI Patient presents for left mid chest wall pain for approximately 6 months.  Patient states pain increased with movement.  Described pain as dull/aching.  States pain radiates to left lateral back.  No palliative measures for complaint.  States pain does increase with consumption of alcohol.  Executive panel labs are unremarkable.  C-reactive protein, amylase, and lipase were normal. Review of Systems ADHD    Objective:   Physical Exam No obvious deformity of the chest wall.  No ecchymosis, edema, or erythema.  Chest wall has full and equal expansion with inspiration.      Assessment & Plan: Left lateral chest wall pain   Patient given a prescription for Voltaren  and Robaxin .  Patient also sent for imaging (x-ray) of the chest.  Patient will follow-up in 1 week.

## 2024-01-25 NOTE — Progress Notes (Signed)
 Pt presents today for follow up on rib pain, pt states no better and not worse just radiating more around the rib cage.

## 2024-01-28 NOTE — Patient Instructions (Incomplete)
 1 week follow-up:   Patient presents for left mid chest wall pain for approximately 6 months.

## 2024-01-31 ENCOUNTER — Ambulatory Visit: Payer: Self-pay | Admitting: Physician Assistant
